# Patient Record
Sex: Male | Born: 2000 | Hispanic: Yes | Marital: Single | Smoking: Never smoker
Health system: Southern US, Community
[De-identification: ages and names within clinical notes are randomized; demographics above are authoritative.]

## PROBLEM LIST (undated history)

## (undated) HISTORY — PX: APPENDECTOMY: SHX54

---
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
## 2020-01-06 ENCOUNTER — Encounter: Payer: Self-pay | Admitting: Emergency Medicine

## 2020-01-06 ENCOUNTER — Other Ambulatory Visit: Payer: Self-pay

## 2020-01-06 DIAGNOSIS — K6289 Other specified diseases of anus and rectum: Secondary | ICD-10-CM | POA: Diagnosis present

## 2020-01-06 DIAGNOSIS — Z5321 Procedure and treatment not carried out due to patient leaving prior to being seen by health care provider: Secondary | ICD-10-CM | POA: Insufficient documentation

## 2020-01-06 NOTE — ED Triage Notes (Addendum)
Pt arrived via POV with reports of rectal pain and rectal swelling since Thursday.  PT states he has swelling to groin/pelvic area on the right side.   Pt states he does not have pain when he has a bowel movement but states when he is finished having a bowel movement it is painful. Pt states he has rectal pain when he is walking. PT states he has noticed some bright red spotting on toilet tissue when he wipes.  Pt also states he has frequent bowel movements and thinks he has IBS.

## 2020-01-07 ENCOUNTER — Emergency Department
Admission: EM | Admit: 2020-01-07 | Discharge: 2020-01-07 | Disposition: A | Discharge status: Home / Self Care | Payer: Medicaid Other | Attending: Emergency Medicine | Admitting: Emergency Medicine

## 2020-01-07 NOTE — ED Notes (Signed)
Pt not visualized in lobby at this time. 

## 2020-07-11 DIAGNOSIS — Z20822 Contact with and (suspected) exposure to covid-19: Secondary | ICD-10-CM | POA: Diagnosis not present

## 2020-08-15 DIAGNOSIS — E119 Type 2 diabetes mellitus without complications: Secondary | ICD-10-CM | POA: Diagnosis not present

## 2020-08-21 DIAGNOSIS — H5211 Myopia, right eye: Secondary | ICD-10-CM | POA: Diagnosis not present

## 2020-08-28 DIAGNOSIS — R0683 Snoring: Secondary | ICD-10-CM | POA: Diagnosis not present

## 2020-08-28 DIAGNOSIS — G4710 Hypersomnia, unspecified: Secondary | ICD-10-CM | POA: Diagnosis not present

## 2020-09-13 DIAGNOSIS — Z713 Dietary counseling and surveillance: Secondary | ICD-10-CM | POA: Diagnosis not present

## 2020-09-13 DIAGNOSIS — E119 Type 2 diabetes mellitus without complications: Secondary | ICD-10-CM | POA: Diagnosis not present

## 2020-09-18 DIAGNOSIS — Z6835 Body mass index (BMI) 35.0-35.9, adult: Secondary | ICD-10-CM | POA: Diagnosis not present

## 2020-09-18 DIAGNOSIS — K2970 Gastritis, unspecified, without bleeding: Secondary | ICD-10-CM | POA: Diagnosis not present

## 2020-09-18 DIAGNOSIS — R69 Illness, unspecified: Secondary | ICD-10-CM | POA: Diagnosis not present

## 2020-09-18 DIAGNOSIS — Z79899 Other long term (current) drug therapy: Secondary | ICD-10-CM | POA: Diagnosis not present

## 2020-09-18 DIAGNOSIS — K3184 Gastroparesis: Secondary | ICD-10-CM | POA: Diagnosis not present

## 2020-09-18 DIAGNOSIS — K3189 Other diseases of stomach and duodenum: Secondary | ICD-10-CM | POA: Diagnosis not present

## 2020-09-18 DIAGNOSIS — Z9089 Acquired absence of other organs: Secondary | ICD-10-CM | POA: Diagnosis not present

## 2020-09-18 DIAGNOSIS — E669 Obesity, unspecified: Secondary | ICD-10-CM | POA: Diagnosis not present

## 2020-09-18 DIAGNOSIS — E119 Type 2 diabetes mellitus without complications: Secondary | ICD-10-CM | POA: Diagnosis not present

## 2020-09-18 DIAGNOSIS — Z7984 Long term (current) use of oral hypoglycemic drugs: Secondary | ICD-10-CM | POA: Diagnosis not present

## 2020-09-18 DIAGNOSIS — K2950 Unspecified chronic gastritis without bleeding: Secondary | ICD-10-CM | POA: Diagnosis not present

## 2020-11-13 DIAGNOSIS — K591 Functional diarrhea: Secondary | ICD-10-CM | POA: Diagnosis not present

## 2020-11-13 DIAGNOSIS — R194 Change in bowel habit: Secondary | ICD-10-CM | POA: Diagnosis not present

## 2020-11-13 DIAGNOSIS — K580 Irritable bowel syndrome with diarrhea: Secondary | ICD-10-CM | POA: Diagnosis not present

## 2020-12-03 DIAGNOSIS — G4700 Insomnia, unspecified: Secondary | ICD-10-CM | POA: Diagnosis not present

## 2020-12-03 DIAGNOSIS — F411 Generalized anxiety disorder: Secondary | ICD-10-CM | POA: Diagnosis not present

## 2020-12-03 DIAGNOSIS — R69 Illness, unspecified: Secondary | ICD-10-CM | POA: Diagnosis not present

## 2020-12-03 DIAGNOSIS — R41840 Attention and concentration deficit: Secondary | ICD-10-CM | POA: Diagnosis not present

## 2021-10-30 ENCOUNTER — Encounter: Payer: Self-pay | Admitting: Dermatology

## 2021-10-30 ENCOUNTER — Ambulatory Visit (INDEPENDENT_AMBULATORY_CARE_PROVIDER_SITE_OTHER): Payer: No Typology Code available for payment source | Admitting: Dermatology

## 2021-10-30 DIAGNOSIS — L719 Rosacea, unspecified: Secondary | ICD-10-CM

## 2021-10-30 DIAGNOSIS — L710 Perioral dermatitis: Secondary | ICD-10-CM

## 2021-10-30 DIAGNOSIS — L649 Androgenic alopecia, unspecified: Secondary | ICD-10-CM | POA: Diagnosis not present

## 2021-10-30 DIAGNOSIS — L209 Atopic dermatitis, unspecified: Secondary | ICD-10-CM

## 2021-10-30 MED ORDER — MINOXIDIL 2.5 MG PO TABS: 2.5000 mg | ORAL_TABLET | Freq: Every day | ORAL | 2 refills | Status: DC

## 2021-10-30 MED ORDER — DOXYCYCLINE HYCLATE 50 MG PO CAPS: 50.0000 mg | ORAL_CAPSULE | Freq: Every day | ORAL | 2 refills | Status: DC

## 2021-10-30 NOTE — Patient Instructions (Addendum)
Perioral Dermatitis:  ?Continue Pimecrolimus as directed.  ?Start Doxycycline 50 mg once daily with dinner.  ? ?Scalp:  ?Recommend using topical Minoxidil as directed on label. ?Start Minoxidil 2.5 mg once daily. ? ?Recommend daily broad spectrum sunscreen SPF 30+ to sun-exposed areas, reapply every 2 hours as needed. Call for new or changing lesions.  ?Staying in the shade or wearing long sleeves, sun glasses (UVA+UVB protection) and wide brim hats (4-inch brim around the entire circumference of the hat) are also recommended for sun protection.  ? ? ?If You Need Anything After Your Visit ? ?If you have any questions or concerns for your doctor, please call our main line at [phone] and press option 4 to reach your doctor's medical assistant. If no one answers, please leave a voicemail as directed and we will return your call as soon as possible. Messages left after 4 pm will be answered the following business day.  ? ?You may also send Lawrence Robles a message via MyChart. We typically respond to MyChart messages within 1-2 business days. ? ?For prescription refills, please ask your pharmacy to contact our office. Our fax number is [phone]. ? ?If you have an urgent issue when the clinic is closed that cannot wait until the next business day, you can page your doctor at the number below.   ? ?Please note that while we do our best to be available for urgent issues outside of office hours, we are not available 24/7.  ? ?If you have an urgent issue and are unable to reach Lawrence Robles, you may choose to seek medical care at your doctor's office, retail clinic, urgent care center, or emergency room. ? ?If you have a medical emergency, please immediately call 911 or go to the emergency department. ? ?Pager Numbers ? ?- Dr. Gwen Pounds: [phone] ? ?- Dr. Neale Burly: [phone] ? ?- Dr. Roseanne Reno: [phone] ? ?In the event of inclement weather, please call our main line at [phone] for an update on the status of any delays or  closures. ? ?Dermatology Medication Tips: ?Please keep the boxes that topical medications come in in order to help keep track of the instructions about where and how to use these. Pharmacies typically print the medication instructions only on the boxes and not directly on the medication tubes.  ? ?If your medication is too expensive, please contact our office at [phone] option 4 or send Lawrence Robles a message through MyChart.  ? ?We are unable to tell what your co-pay for medications will be in advance as this is different depending on your insurance coverage. However, we may be able to find a substitute medication at lower cost or fill out paperwork to get insurance to cover a needed medication.  ? ?If a prior authorization is required to get your medication covered by your insurance company, please allow Lawrence Robles 1-2 business days to complete this process. ? ?Drug prices often vary depending on where the prescription is filled and some pharmacies may offer cheaper prices. ? ?The website www.goodrx.com contains coupons for medications through different pharmacies. The prices here do not account for what the cost may be with help from insurance (it may be cheaper with your insurance), but the website can give you the price if you did not use any insurance.  ?- You can print the associated coupon and take it with your prescription to the pharmacy.  ?- You may also stop by our office during regular business hours and pick up a GoodRx coupon card.  ?- If  you need your prescription sent electronically to a different pharmacy, notify our office through [facility] or by phone at [phone] option 4. ? ? ? ? ?Si Usted Necesita Algo Despu?s de Su Visita ? ?Tambi?n puede enviarnos un mensaje a trav?s de MyChart. Por lo general respondemos a los mensajes de MyChart en el transcurso de 1 a 2 d?as h?biles. ? ?Para renovar recetas, por favor pida a su farmacia que se ponga en contacto con nuestra oficina. Nuestro n?mero de fax  es el [phone]. ? ?Si tiene un asunto urgente cuando la cl?nica est? cerrada y que no puede esperar hasta el siguiente d?a h?bil, puede llamar/localizar a su doctor(a) al n?mero que aparece a continuaci?n.  ? ?Por favor, tenga en cuenta que aunque hacemos todo lo posible para estar disponibles para asuntos urgentes fuera del horario de oficina, no estamos disponibles las 24 horas del d?a, los 7 d?as de la semana.  ? ?Si tiene un problema urgente y no puede comunicarse con nosotros, puede optar por buscar atenci?n m?dica  en el consultorio de su doctor(a), en una cl?nica privada, en un centro de atenci?n urgente o en una sala de emergencias. ? ?Si tiene Engineer, maintenance (IT) m?dica, por favor llame inmediatamente al 911 o vaya a la sala de emergencias. ? ?N?meros de b?per ? ?- Dr. Nehemiah Massed: [phone] ? ?- Dra. Moye: [phone] ? ?- Dra. Nicole [phone] ? ?En caso de inclemencias del tiempo, por favor llame a nuestra l?nea principal al [phone] para una actualizaci?n sobre el estado de cualquier retraso o cierre. ? ?Consejos para la medicaci?n en dermatolog?a: ?Por favor, guarde las cajas en las que vienen los medicamentos de uso t?pico para ayudarle a seguir las instrucciones sobre d?nde y c?mo usarlos. Las farmacias generalmente imprimen las instrucciones del medicamento s?lo en las cajas y no directamente en los tubos del [place].  ? ?Si su medicamento es muy caro, por favor, p?ngase en contacto con Zigmund Daniel llamando al [phone] y presione la opci?n 4 o env?enos un mensaje a trav?s de MyChart.  ? ?No podemos decirle cu?l ser? su copago por los medicamentos por adelantado ya que esto es diferente dependiendo de la cobertura de su seguro. Sin embargo, es posible que podamos encontrar un medicamento sustituto a Electrical engineer un formulario para que el seguro cubra el medicamento que se considera necesario.  ? ?Si se requiere Ardelia Mems autorizaci?n previa para que su compa??a de seguros [place]  su medicamento, por favor perm?tanos de 1 a 2 d?as h?biles para completar este proceso. ? ?Los precios de los medicamentos var?an con frecuencia dependiendo del Environmental consultant de d?nde se surte la receta y alguna farmacias pueden ofrecer precios m?s baratos. ? ?El sitio web www.goodrx.com tiene cupones para medicamentos de Airline pilot. Los precios aqu? no tienen en cuenta lo que podr?a costar con la ayuda del seguro (puede ser m?s barato con su seguro), pero el sitio web puede darle el precio si no utiliz? ning?n seguro.  ?- Puede imprimir el cup?n correspondiente y llevarlo con su receta a la farmacia.  ?- Tambi?n puede pasar por nuestra oficina durante el horario de atenci?n regular y recoger una tarjeta de cupones de GoodRx.  ?- Si necesita que su receta se env?e electr?nicamente a Chiropodist, informe a nuestra oficina a trav?s de MyChart de [place] o por tel?fono llamando al [phone] y presione la opci?n 4.  ?

## 2021-10-30 NOTE — Progress Notes (Signed)
? ?  New Patient Visit ? ?Subjective  ?Lawrence Robles is a 21 y.o. male who presents for the following: Alopecia (Scalp. Dur years. Family hx of hair thinning, father. ) and Rash (Hx of eczema since childhood. 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 No active rash today. Uses Triamcinolone 0.5% cream as needed for flares. Thinks has perioral dermatitis on face. Has been using pimecrolimus on face, helping some). ? ?Review of Systems: No other skin or systemic complaints except as noted in HPI or Assessment and Plan. ? ?Objective  ?Well appearing patient in no apparent distress; mood and affect are within normal limits. ? ?A focused examination was performed including face, neck, scalp, feet. Relevant physical exam findings are noted in the Assessment and Plan. ? ?perioral, nasolabial ?Pink patches with greasy scale.  ? ?Scalp ?Diffuse thinning of hair at vertex or temples.  ? ?Left Hand - Anterior ?Hands and feet clear today ? ? 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 Condition is symptomatic / bothersome to patient. Not to goal. ? ?Continue Pimecrolimus as directed.  ?Start Doxycycline 50 mg once daily with dinner.  ? ?doxycycline (VIBRAMYCIN) 50 MG capsule - perioral, nasolabial ?Take 1 capsule (50 mg total) by mouth daily. Take with dinner ? 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 Condition is symptomatic / bothersome to patient. Not to goal. ? ?Recommend using topical Minoxidil as directed on label. ?Start Minoxidil 2.5 mg once daily. ? ?BP: 136/83 ?Pulse: 85 ?Wt: 165 lbs ? ? Androgenetic Alopecia (or Male pattern hair loss) refers to the common patterned hair loss affecting many men.  Male pattern alopecia is mediated by dihydrotestosterone which induces miniaturization of androgen-sensitive hair follicles.  It is chronic and  persistent, but treatable; not curable. ?Topical treatment includes: ?- 5% topical Minoxidil ?Oral treatment includes: ?- Finasteride 1 mg qd ?- Minoxidil 1.25 - 5 mg qd ?- Dutasteride 0.5 mg qd ?Adjunct therapy includes: ?- Low Level Laser Light Therapy (LLLT) ?- Platelet-rich Plasma injections (PRP) ?- Hair Transplantation or scalp reduction ? ?minoxidil (LONITEN) 2.5 MG tablet - Scalp ?Take 1 tablet (2.5 mg total) by mouth daily. ? 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 It is often associated with allergic rhinitis and/or asthma and can require treatment with topical medications, phototherapy, or in severe cases biologic injectable medication (Dupixent; Adbry) or Oral JAK inhibitors. ? ?May use triamcinolone 0.5% cream as needed for flares. ?May also use pimecrolimus on the hands when less significant ?Moisturizers as needed such as CeraVe cream ? ?Return in about 2 months (around 12/30/2021) for Alopecia Follow Up, Rash Follow Up. ? ?I, Lawson Radar, CMA, am acting as scribe for Armida Sans, MD. ?Documentation: I have reviewed the above documentation for accuracy and completeness, and I agree with the above. ? ?Armida Sans, MD ? ? ?

## 2021-11-06 ENCOUNTER — Other Ambulatory Visit: Payer: Self-pay

## 2021-11-10 MED ORDER — SULFACETAMIDE SODIUM-SULFUR 10-5 % EX CREA: TOPICAL_CREAM | CUTANEOUS | 3 refills | Status: DC

## 2021-11-11 ENCOUNTER — Encounter: Payer: Self-pay | Admitting: Dermatology

## 2021-11-11 MED ORDER — DOXYCYCLINE 40 MG PO CPDR: 40.0000 mg | DELAYED_RELEASE_CAPSULE | ORAL | 3 refills | Status: DC

## 2021-12-16 ENCOUNTER — Other Ambulatory Visit: Payer: Self-pay | Admitting: Dermatology

## 2021-12-25 ENCOUNTER — Ambulatory Visit (INDEPENDENT_AMBULATORY_CARE_PROVIDER_SITE_OTHER): Payer: No Typology Code available for payment source | Admitting: Dermatology

## 2021-12-25 ENCOUNTER — Encounter: Payer: Self-pay | Admitting: Dermatology

## 2021-12-25 DIAGNOSIS — L659 Nonscarring hair loss, unspecified: Secondary | ICD-10-CM

## 2021-12-25 DIAGNOSIS — L649 Androgenic alopecia, unspecified: Secondary | ICD-10-CM

## 2021-12-25 DIAGNOSIS — L658 Other specified nonscarring hair loss: Secondary | ICD-10-CM | POA: Diagnosis not present

## 2021-12-25 DIAGNOSIS — L710 Perioral dermatitis: Secondary | ICD-10-CM

## 2021-12-25 MED ORDER — MINOXIDIL 2.5 MG PO TABS: 2.5000 mg | ORAL_TABLET | Freq: Every day | ORAL | 2 refills | Status: DC

## 2021-12-25 NOTE — Progress Notes (Deleted)
   Follow-Up Visit   Subjective  Lawrence Robles is a 21 y.o. male who presents for the following: Follow-up.    The following portions of the chart were reviewed this encounter and updated as appropriate:       Review of Systems:  No other skin or systemic complaints except as noted in HPI or Assessment and Plan.  Objective  Well appearing patient in no apparent distress; mood and affect are within normal limits.  {Exam:34163::"A full examination was performed including scalp, head, eyes, ears, nose, lips, neck, chest, axillae, abdomen, back, buttocks, bilateral upper extremities, bilateral lower extremities, hands, feet, fingers, toes, fingernails, and toenails. All findings within normal limits unless otherwise noted below."}    Assessment & Plan   No follow-ups on file.  IAngelique Holm, CMA, am acting as scribe for Armida Sans, MD .

## 2021-12-25 NOTE — Progress Notes (Unsigned)
   Follow-Up Visit   Subjective  Lawrence Robles is a 21 y.o. male who presents for the following: Follow-up (2 months f/u on perioral dermatitis treating with Doxycycline 50 mg daily and pimecrolimus cream ) and Alopecia (2 months f/u alopecia on the scalp treating with Minoxidil 2.5 mg 1 tablet daily with a good response ).  The following portions of the chart were reviewed this encounter and updated as appropriate:   Tobacco  Allergies  Meds  Problems  Med Hx  Surg Hx  Fam Hx     Review of Systems:  No other skin or systemic complaints except as noted in HPI or Assessment and Plan.  Objective  Well appearing patient in no apparent distress; mood and affect are within normal limits.  A focused examination was performed including face,scalp. Relevant physical exam findings are noted in the Assessment and Plan.  Scalp Diffuse thinning of hair at vertex or temples.   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  Male pattern alopecia is mediated by dihydrotestosterone which induces miniaturization of androgen-sensitive hair follicles.  It is chronic and persistent, but treatable; not curable. Topical treatment includes: - 5% topical Minoxidil Oral treatment includes: - Finasteride 1 mg qd - Minoxidil 1.25 - 5 mg qd - Dutasteride 0.5 mg qd Adjunct therapy includes: - Low Level Laser Light Therapy (LLLT) - Platelet-rich Plasma injections (PRP) - Hair Transplantation or scalp reduction   Continue Minoxidil 2.5 mg take 1 tablet daily  Start Finasteride 1 mg daily   Perioral dermatitis perioral, nasolabial Improving Chronic and persistent condition with duration or expected duration over one year. Condition is symptomatic / bothersome to patient. Not to goal.   Continue Pimecrolimus as  directed.  Continue Doxycycline 50 mg once daily with dinner.   Related Medications minoxidil (LONITEN) 2.5 MG tablet Take 1 tablet (2.5 mg total) by mouth daily.  Atopic Dermatitis / dyshidrosis  Hands and feet Prn [facility] 0.5% cr or Pimecrolimus cream.   Long term medication management.  Patient is using long term (months to years) prescription medication  to control their dermatologic condition.  These medications require periodic monitoring to evaluate for efficacy and side effects and may require periodic laboratory monitoring.  Return in about 1 year (around 12/26/2022) for Alopecia, Perioral dermatitis .  IAngelique Holm, CMA, am acting as scribe for Armida Sans, MD .  Documentation: I have reviewed the above documentation for accuracy and completeness, and I agree with the above.  Armida Sans, MD

## 2021-12-25 NOTE — Patient Instructions (Signed)
Due to recent changes in healthcare laws, you may see results of your pathology and/or laboratory studies on MyChart before the doctors have had a chance to review them. We understand that in some cases there may be results that are confusing or concerning to you. Please understand that not all results are received at the same time and often the doctors may need to interpret multiple results in order to provide you with the best plan of care or course of treatment. Therefore, we ask that you please give us 2 business days to thoroughly review all your results before contacting the office for clarification. Should we see a critical lab result, you will be contacted sooner.   If You Need Anything After Your Visit  If you have any questions or concerns for your doctor, please call our main line at [phone] and press option 4 to reach your doctor's medical assistant. If no one answers, please leave a voicemail as directed and we will return your call as soon as possible. Messages left after 4 pm will be answered the following business day.   You may also send us a message via MyChart. We typically respond to MyChart messages within 1-2 business days.  For prescription refills, please ask your pharmacy to contact our office. Our fax number is [phone].  If you have an urgent issue when the clinic is closed that cannot wait until the next business day, you can page your doctor at the number below.    Please note that while we do our best to be available for urgent issues outside of office hours, we are not available 24/7.   If you have an urgent issue and are unable to reach us, you may choose to seek medical care at your doctor's office, retail clinic, urgent care center, or emergency room.  If you have a medical emergency, please immediately call 911 or go to the emergency department.  Pager Numbers  - Dr. Kowalski: [phone]  - Dr. Moye: [phone]  - Dr. Stewart:  [phone]  In the event of inclement weather, please call our main line at [phone] for an update on the status of any delays or closures.  Dermatology Medication Tips: Please keep the boxes that topical medications come in in order to help keep track of the instructions about where and how to use these. Pharmacies typically print the medication instructions only on the boxes and not directly on the medication tubes.   If your medication is too expensive, please contact our office at [phone] option 4 or send us a message through MyChart.   We are unable to tell what your co-pay for medications will be in advance as this is different depending on your insurance coverage. However, we may be able to find a substitute medication at lower cost or fill out paperwork to get insurance to cover a needed medication.   If a prior authorization is required to get your medication covered by your insurance company, please allow us 1-2 business days to complete this process.  Drug prices often vary depending on where the prescription is filled and some pharmacies may offer cheaper prices.  The website www.goodrx.com contains coupons for medications through different pharmacies. The prices here do not account for what the cost may be with help from insurance (it may be cheaper with your insurance), but the website can give you the price if you did not use any insurance.  - You can print the associated coupon and take it with   your prescription to the pharmacy.  - You may also stop by our office during regular business hours and pick up a GoodRx coupon card.  - If you need your prescription sent electronically to a different pharmacy, notify our office through [facility] MyChart or by phone at [phone] option 4.     Si Usted Necesita Algo Despus de Su Visita  Tambin puede enviarnos un mensaje a travs de MyChart. Por lo general respondemos a los mensajes de MyChart en el transcurso de 1 a 2  das hbiles.  Para renovar recetas, por favor pida a su farmacia que se ponga en contacto con nuestra oficina. Nuestro nmero de fax es el [phone].  Si tiene un asunto urgente cuando la clnica est cerrada y que no puede esperar hasta el siguiente da hbil, puede llamar/localizar a su doctor(a) al nmero que aparece a continuacin.   Por favor, tenga en cuenta que aunque hacemos todo lo posible para estar disponibles para asuntos urgentes fuera del horario de oficina, no estamos disponibles las 24 horas del da, los 7 das de la semana.   Si tiene un problema urgente y no puede comunicarse con nosotros, puede optar por buscar atencin mdica  en el consultorio de su doctor(a), en una clnica privada, en un centro de atencin urgente o en una sala de emergencias.  Si tiene una emergencia mdica, por favor llame inmediatamente al 911 o vaya a la sala de emergencias.  Nmeros de bper  - Dr. Kowalski: [phone]  - Dra. Moye: [phone]  - Dra. Stewart: [phone]  En caso de inclemencias del tiempo, por favor llame a nuestra lnea principal al [phone] para una actualizacin sobre el estado de cualquier retraso o cierre.  Consejos para la medicacin en dermatologa: Por favor, guarde las cajas en las que vienen los medicamentos de uso tpico para ayudarle a seguir las instrucciones sobre dnde y cmo usarlos. Las farmacias generalmente imprimen las instrucciones del medicamento slo en las cajas y no directamente en los tubos del medicamento.   Si su medicamento es muy caro, por favor, pngase en contacto con nuestra oficina llamando al [phone] y presione la opcin 4 o envenos un mensaje a travs de MyChart.   No podemos decirle cul ser su copago por los medicamentos por adelantado ya que esto es diferente dependiendo de la cobertura de su seguro. Sin embargo, es posible que podamos encontrar un medicamento sustituto a menor costo o llenar un formulario para que el  seguro cubra el medicamento que se considera necesario.   Si se requiere una autorizacin previa para que su compaa de seguros cubra su medicamento, por favor permtanos de 1 a 2 das hbiles para completar este proceso.  Los precios de los medicamentos varan con frecuencia dependiendo del lugar de dnde se surte la receta y alguna farmacias pueden ofrecer precios ms baratos.  El sitio web www.goodrx.com tiene cupones para medicamentos de diferentes farmacias. Los precios aqu no tienen en cuenta lo que podra costar con la ayuda del seguro (puede ser ms barato con su seguro), pero el sitio web puede darle el precio si no utiliz ningn seguro.  - Puede imprimir el cupn correspondiente y llevarlo con su receta a la farmacia.  - Tambin puede pasar por nuestra oficina durante el horario de atencin regular y recoger una tarjeta de cupones de GoodRx.  - Si necesita que su receta se enve electrnicamente a una farmacia diferente, informe a nuestra oficina a travs de MyChart de [facility]   o por telfono llamando al [phone] y presione la opcin 4.  

## 2021-12-31 MED ORDER — FINASTERIDE 1 MG PO TABS: 1.0000 mg | ORAL_TABLET | Freq: Every day | ORAL | 1 refills | Status: DC

## 2022-01-13 ENCOUNTER — Encounter: Payer: Self-pay | Admitting: Dermatology

## 2022-02-20 ENCOUNTER — Other Ambulatory Visit: Payer: Self-pay | Admitting: Dermatology

## 2022-02-20 DIAGNOSIS — L649 Androgenic alopecia, unspecified: Secondary | ICD-10-CM

## 2022-03-29 ENCOUNTER — Encounter: Payer: Self-pay | Admitting: Dermatology

## 2022-04-01 ENCOUNTER — Other Ambulatory Visit: Payer: Self-pay

## 2022-04-01 DIAGNOSIS — L659 Nonscarring hair loss, unspecified: Secondary | ICD-10-CM

## 2022-04-01 MED ORDER — FINASTERIDE 1 MG PO TABS: 1.0000 mg | ORAL_TABLET | Freq: Every day | ORAL | 0 refills | Status: DC

## 2022-04-04 ENCOUNTER — Other Ambulatory Visit: Payer: Self-pay | Admitting: Dermatology

## 2022-04-04 DIAGNOSIS — L719 Rosacea, unspecified: Secondary | ICD-10-CM

## 2022-05-02 ENCOUNTER — Encounter: Payer: Self-pay | Admitting: Dermatology

## 2022-07-05 ENCOUNTER — Other Ambulatory Visit: Payer: Self-pay | Admitting: Dermatology

## 2022-07-05 DIAGNOSIS — L659 Nonscarring hair loss, unspecified: Secondary | ICD-10-CM

## 2022-08-20 ENCOUNTER — Encounter: Payer: Self-pay | Admitting: Dermatology

## 2022-08-20 DIAGNOSIS — L710 Perioral dermatitis: Secondary | ICD-10-CM

## 2022-08-20 NOTE — Telephone Encounter (Signed)
Fine to send in both metronidazole cream to use twice a day and protopic to use once a day. Recommend sending protopic rx to [facility] or [facility] for better pricing.  We could also give him some samples of zilxi and epsolay to try. Each of these can be used a thin layer to affected areas at night and can be helpful for rosacea/perioral dermatitis.  I would recommend stopping the pimecrolimus. Thank you!

## 2022-08-27 ENCOUNTER — Other Ambulatory Visit: Payer: Self-pay | Admitting: Dermatology

## 2022-08-27 DIAGNOSIS — L710 Perioral dermatitis: Secondary | ICD-10-CM

## 2022-08-27 MED ORDER — METRONIDAZOLE 0.75 % EX CREA: TOPICAL_CREAM | Freq: Two times a day (BID) | CUTANEOUS | 3 refills | Status: DC

## 2022-08-27 MED ORDER — TACROLIMUS 0.1 % EX OINT: TOPICAL_OINTMENT | CUTANEOUS | 0 refills | Status: DC

## 2022-08-27 NOTE — Addendum Note (Signed)
Addended by: Rudell Cobb A on: 08/27/2022 02:16 PM   Modules accepted: Orders

## 2022-10-01 ENCOUNTER — Other Ambulatory Visit: Payer: Self-pay | Admitting: Dermatology

## 2022-10-01 DIAGNOSIS — L710 Perioral dermatitis: Secondary | ICD-10-CM

## 2022-10-01 DIAGNOSIS — L659 Nonscarring hair loss, unspecified: Secondary | ICD-10-CM

## 2022-12-30 ENCOUNTER — Ambulatory Visit (INDEPENDENT_AMBULATORY_CARE_PROVIDER_SITE_OTHER): Payer: BC Managed Care – PPO | Admitting: Dermatology

## 2022-12-30 VITALS — BP 141/81 | HR 109

## 2022-12-30 DIAGNOSIS — L659 Nonscarring hair loss, unspecified: Secondary | ICD-10-CM

## 2022-12-30 DIAGNOSIS — Z79899 Other long term (current) drug therapy: Secondary | ICD-10-CM

## 2022-12-30 DIAGNOSIS — L649 Androgenic alopecia, unspecified: Secondary | ICD-10-CM

## 2022-12-30 DIAGNOSIS — L2089 Other atopic dermatitis: Secondary | ICD-10-CM

## 2022-12-30 DIAGNOSIS — L710 Perioral dermatitis: Secondary | ICD-10-CM | POA: Diagnosis not present

## 2022-12-30 DIAGNOSIS — Z7189 Other specified counseling: Secondary | ICD-10-CM

## 2022-12-30 MED ORDER — FINASTERIDE 1 MG PO TABS: 1.0000 mg | ORAL_TABLET | Freq: Every day | ORAL | 3 refills | Status: DC

## 2022-12-30 MED ORDER — MINOXIDIL 2.5 MG PO TABS
2.5000 mg | ORAL_TABLET | Freq: Every day | ORAL | 3 refills | Status: DC
Start: 2022-12-30 — End: 2023-06-07

## 2022-12-30 NOTE — Patient Instructions (Signed)
Instructions for [facility] Medications  One or more of your medications was sent to the [facility] mail order compounding pharmacy. You will receive an email from them and can purchase the medicine through that link. It will then be mailed to your home at the address you confirmed. If for any reason you do not receive an email from them, please check your spam folder. If you still do not find the email, please let us know. [facility] phone number is [phone].       Due to recent changes in healthcare laws, you may see results of your pathology and/or laboratory studies on MyChart before the doctors have had a chance to review them. We understand that in some cases there may be results that are confusing or concerning to you. Please understand that not all results are received at the same time and often the doctors may need to interpret multiple results in order to provide you with the best plan of care or course of treatment. Therefore, we ask that you please give us 2 business days to thoroughly review all your results before contacting the office for clarification. Should we see a critical lab result, you will be contacted sooner.   If You Need Anything After Your Visit  If you have any questions or concerns for your doctor, please call our main line at [phone] and press option 4 to reach your doctor's medical assistant. If no one answers, please leave a voicemail as directed and we will return your call as soon as possible. Messages left after 4 pm will be answered the following business day.   You may also send us a message via MyChart. We typically respond to MyChart messages within 1-2 business days.  For prescription refills, please ask your pharmacy to contact our office. Our fax number is [phone].  If you have an urgent issue when the clinic is closed that cannot wait until the next business day, you can page your doctor at the number below.    Please note  that while we do our best to be available for urgent issues outside of office hours, we are not available 24/7.   If you have an urgent issue and are unable to reach us, you may choose to seek medical care at your doctor's office, retail clinic, urgent care center, or emergency room.  If you have a medical emergency, please immediately call 911 or go to the emergency department.  Pager Numbers  - Dr. Kowalski: [phone]  - Dr. Moye: [phone]  - Dr. Stewart: [phone]  In the event of inclement weather, please call our main line at [phone] for an update on the status of any delays or closures.  Dermatology Medication Tips: Please keep the boxes that topical medications come in in order to help keep track of the instructions about where and how to use these. Pharmacies typically print the medication instructions only on the boxes and not directly on the medication tubes.   If your medication is too expensive, please contact our office at [phone] option 4 or send us a message through MyChart.   We are unable to tell what your co-pay for medications will be in advance as this is different depending on your insurance coverage. However, we may be able to find a substitute medication at lower cost or fill out paperwork to get insurance to cover a needed medication.   If a prior authorization is required to get your medication covered by your insurance   company, please allow us 1-2 business days to complete this process.  Drug prices often vary depending on where the prescription is filled and some pharmacies may offer cheaper prices.  The website www.goodrx.com contains coupons for medications through different pharmacies. The prices here do not account for what the cost may be with help from insurance (it may be cheaper with your insurance), but the website can give you the price if you did not use any insurance.  - You can print the associated coupon and take it with your  prescription to the pharmacy.  - You may also stop by our office during regular business hours and pick up a GoodRx coupon card.  - If you need your prescription sent electronically to a different pharmacy, notify our office through [facility] MyChart or by phone at [phone] option 4.     Si Usted Necesita Algo Despus de Su Visita  Tambin puede enviarnos un mensaje a travs de MyChart. Por lo general respondemos a los mensajes de MyChart en el transcurso de 1 a 2 das hbiles.  Para renovar recetas, por favor pida a su farmacia que se ponga en contacto con nuestra oficina. Nuestro nmero de fax es el [phone].  Si tiene un asunto urgente cuando la clnica est cerrada y que no puede esperar hasta el siguiente da hbil, puede llamar/localizar a su doctor(a) al nmero que aparece a continuacin.   Por favor, tenga en cuenta que aunque hacemos todo lo posible para estar disponibles para asuntos urgentes fuera del horario de oficina, no estamos disponibles las 24 horas del da, los 7 das de la semana.   Si tiene un problema urgente y no puede comunicarse con nosotros, puede optar por buscar atencin mdica  en el consultorio de su doctor(a), en una clnica privada, en un centro de atencin urgente o en una sala de emergencias.  Si tiene una emergencia mdica, por favor llame inmediatamente al 911 o vaya a la sala de emergencias.  Nmeros de bper  - Dr. Kowalski: [phone]  - Dra. Moye: [phone]  - Dra. Stewart: [phone]  En caso de inclemencias del tiempo, por favor llame a nuestra lnea principal al [phone] para una actualizacin sobre el estado de cualquier retraso o cierre.  Consejos para la medicacin en dermatologa: Por favor, guarde las cajas en las que vienen los medicamentos de uso tpico para ayudarle a seguir las instrucciones sobre dnde y cmo usarlos. Las farmacias generalmente imprimen las instrucciones del medicamento slo en las cajas y no  directamente en los tubos del medicamento.   Si su medicamento es muy caro, por favor, pngase en contacto con nuestra oficina llamando al [phone] y presione la opcin 4 o envenos un mensaje a travs de MyChart.   No podemos decirle cul ser su copago por los medicamentos por adelantado ya que esto es diferente dependiendo de la cobertura de su seguro. Sin embargo, es posible que podamos encontrar un medicamento sustituto a menor costo o llenar un formulario para que el seguro cubra el medicamento que se considera necesario.   Si se requiere una autorizacin previa para que su compaa de seguros cubra su medicamento, por favor permtanos de 1 a 2 das hbiles para completar este proceso.  Los precios de los medicamentos varan con frecuencia dependiendo del lugar de dnde se surte la receta y alguna farmacias pueden ofrecer precios ms baratos.  El sitio web www.goodrx.com tiene cupones para medicamentos de diferentes farmacias. Los precios aqu no tienen en cuenta   lo que podra costar con la ayuda del seguro (puede ser ms barato con su seguro), pero el sitio web puede darle el precio si no utiliz ningn seguro.  - Puede imprimir el cupn correspondiente y llevarlo con su receta a la farmacia.  - Tambin puede pasar por nuestra oficina durante el horario de atencin regular y recoger una tarjeta de cupones de GoodRx.  - Si necesita que su receta se enve electrnicamente a una farmacia diferente, informe a nuestra oficina a travs de MyChart de [facility] o por telfono llamando al [phone] y presione la opcin 4.  

## 2022-12-30 NOTE — Progress Notes (Signed)
Follow-Up Visit   Subjective  Lawrence Robles is a 22 y.o. 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 He thinks his hair loss has stabilized and would like to continue medications. Perioral dermatitis - patient currently using Oracea 40 mg po every day and pimecrolimus PRN. He did try to decrease using Oracea, but he started to flare again. He does still experience redness and irritation around his nose, and Pimecrolimus helps some, but he doesn't like using a topical everyday. 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   The following portions of the chart were reviewed this encounter and updated as appropriate: medications, allergies, medical history  Review of Systems:  No other skin or systemic complaints except as noted in HPI or Assessment and Plan.  Objective  Well appearing patient in no apparent distress; mood and affect are within normal limits. A focused examination was performed of the following areas: the face, hair, and hands Relevant exam findings are noted in the Assessment and Plan.   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  Male pattern alopecia is mediated by dihydrotestosterone which induces miniaturization of androgen-sensitive hair follicles.  It is chronic and persistent, but treatable; not curable. Topical treatment includes: - 5% topical Minoxidil Oral treatment includes: - Finasteride 1 mg qd - Minoxidil 1.25 - 5 mg qd - Dutasteride 0.5 mg qd Adjunct therapy includes: - Low Level Laser Light Therapy (LLLT) - Platelet-rich Plasma injections (PRP) - Hair Transplantation or scalp reduction    Continue Minoxidil 2.5 mg take 1 tablet daily  And  Finasteride 1 mg daily No side effects from either of these meds.  No ankle swelling or Libido change.  Pt wants to continue both of these meds for hair maintenance. Long term medication management.  Patient is using long term (months to years) prescription medication  to control their dermatologic condition.  These medications require periodic monitoring to evaluate for efficacy and side effects and may require periodic laboratory monitoring.  Perioral dermatitis perioral, nasolabial Improving Chronic and persistent condition with duration or expected duration over one year. Condition is symptomatic / bothersome to patient. Not to goal. Continue Pimecrolimus as directed.  Continue Oracea 40 mg po once daily with dinner.  Will prescribe [employer] rosacea mix once to twice daily as needed.  ATOPIC DERMATITIS/dyshidrosis  Exam: Clear Chronic condition with duration or expected duration over one year. Currently well-controlled.  Atopic dermatitis (eczema) is a chronic, relapsing, pruritic condition that can significantly affect quality of life. It is often associated with allergic rhinitis and/or asthma and can require treatment with topical medications, phototherapy, or in severe cases biologic injectable medication (Dupixent; Adbry) or Oral JAK inhibitors.  Treatment Plan: Continue [facility] 0.1% cream to aa's once to twice daily PRN. Topical steroids (such as triamcinolone, fluocinolone, fluocinonide, mometasone, clobetasol, halobetasol, betamethasone, hydrocortisone) can cause thinning and lightening of the skin if they are used for too long in the same area. Your physician has selected the right strength medicine for your problem and area affected on the body. Please use your medication only as directed by your physician to prevent side effects.  Continue Pimecrolimus cream to aa's once to twice daily PRN.  Recommend gentle skin care. Consider Eucrisa or Opzelura if needed in the future.    Return in about 1 year (around 12/30/2023) for medication refills.  Maylene Roes, CMA, am acting as scribe for Armida Sans, MD .  Documentation: I have reviewed the above documentation for accuracy and completeness, and I agree with the above.  Armida Sans, MD

## 2023-01-01 ENCOUNTER — Encounter: Payer: Self-pay | Admitting: Dermatology

## 2023-01-20 ENCOUNTER — Other Ambulatory Visit: Payer: Self-pay | Admitting: Dermatology

## 2023-01-20 DIAGNOSIS — L659 Nonscarring hair loss, unspecified: Secondary | ICD-10-CM

## 2023-06-02 ENCOUNTER — Emergency Department (HOSPITAL_COMMUNITY)
Admission: EM | Admit: 2023-06-02 | Discharge: 2023-06-04 | Disposition: A | Discharge status: Other Acute Inpatient Hospital | Payer: BC Managed Care – PPO | Attending: Emergency Medicine | Admitting: Emergency Medicine

## 2023-06-02 ENCOUNTER — Other Ambulatory Visit: Payer: Self-pay

## 2023-06-02 DIAGNOSIS — F32A Depression, unspecified: Secondary | ICD-10-CM

## 2023-06-02 DIAGNOSIS — F332 Major depressive disorder, recurrent severe without psychotic features: Secondary | ICD-10-CM | POA: Diagnosis not present

## 2023-06-02 DIAGNOSIS — R45851 Suicidal ideations: Secondary | ICD-10-CM | POA: Diagnosis not present

## 2023-06-02 LAB — URINE DRUG SCREEN, QUALITATIVE (ARMC ONLY)
Amphetamines, Ur Screen: POSITIVE — AB
Barbiturates, Ur Screen: NOT DETECTED
Benzodiazepine, Ur Scrn: NOT DETECTED
Cannabinoid 50 Ng, Ur ~~LOC~~: NOT DETECTED
Cocaine Metabolite,Ur ~~LOC~~: NOT DETECTED
MDMA (Ecstasy)Ur Screen: NOT DETECTED
Methadone Scn, Ur: NOT DETECTED
Opiate, Ur Screen: POSITIVE — AB
Phencyclidine (PCP) Ur S: NOT DETECTED
Tricyclic, Ur Screen: NOT DETECTED

## 2023-06-02 LAB — CBC
HCT: 40.8 % (ref 39.0–52.0)
Hemoglobin: 14.9 g/dL (ref 13.0–17.0)
MCH: 33 pg (ref 26.0–34.0)
MCHC: 36.5 g/dL — ABNORMAL HIGH (ref 30.0–36.0)
MCV: 90.3 fL (ref 80.0–100.0)
Platelets: 269 10*3/uL (ref 150–400)
RBC: 4.52 MIL/uL (ref 4.22–5.81)
RDW: 11.9 % (ref 11.5–15.5)
WBC: 5.4 10*3/uL (ref 4.0–10.5)
nRBC: 0 % (ref 0.0–0.2)

## 2023-06-02 LAB — COMPREHENSIVE METABOLIC PANEL
ALT: 21 U/L (ref 0–44)
AST: 21 U/L (ref 15–41)
Albumin: 4.6 g/dL (ref 3.5–5.0)
Alkaline Phosphatase: 53 U/L (ref 38–126)
Anion gap: 10 (ref 5–15)
BUN: 19 mg/dL (ref 6–20)
CO2: 27 mmol/L (ref 22–32)
Calcium: 9 mg/dL (ref 8.9–10.3)
Chloride: 99 mmol/L (ref 98–111)
Creatinine, Ser: 0.86 mg/dL (ref 0.61–1.24)
GFR, Estimated: >60 mL/min (ref >60)
Glucose, Bld: 72 mg/dL (ref 70–99)
Potassium: 3.2 mmol/L — ABNORMAL LOW (ref 3.5–5.1)
Sodium: 136 mmol/L (ref 135–145)
Total Bilirubin: 0.9 mg/dL (ref <1.2)
Total Protein: 7.3 g/dL (ref 6.5–8.1)

## 2023-06-02 LAB — ACETAMINOPHEN LEVEL: Acetaminophen (Tylenol), Serum: 14 ug/mL (ref 10–30)

## 2023-06-02 LAB — ETHANOL: Alcohol, Ethyl (B): <10 mg/dL (ref <10)

## 2023-06-02 LAB — SALICYLATE LEVEL: Salicylate Lvl: <7 mg/dL — ABNORMAL LOW (ref 7.0–30.0)

## 2023-06-02 NOTE — ED Notes (Addendum)
Navy shoes Green set of scrubs Black long sleeve shirt White socks Grey underwear Black jacket 9 pieces of jewelry  Cell phone

## 2023-06-02 NOTE — ED Triage Notes (Signed)
Pt presents from with IVC papers and SI. Pt cooperative.

## 2023-06-02 NOTE — ED Notes (Signed)
Pt was given water.

## 2023-06-03 DIAGNOSIS — R45851 Suicidal ideations: Secondary | ICD-10-CM

## 2023-06-03 LAB — ACETAMINOPHEN LEVEL: Acetaminophen (Tylenol), Serum: 10 ug/mL (ref 10–30)

## 2023-06-03 MED ORDER — ESCITALOPRAM OXALATE 10 MG PO TABS
10.0000 mg | ORAL_TABLET | Freq: Every day | ORAL | Status: DC
  Administered 2023-06-03: 10 mg via ORAL
  Filled 2023-06-03: qty 1

## 2023-06-03 MED ORDER — HYDROXYZINE HCL 25 MG PO TABS: 25.0000 mg | ORAL_TABLET | Freq: Three times a day (TID) | ORAL | Status: DC | PRN

## 2023-06-03 NOTE — ED Provider Notes (Signed)
[facility] Provider Note    Event Date/Time   First MD Initiated Contact with Patient 06/02/23 2307     (approximate)   History   Suicidal   HPI Lawrence Robles is a 22 y.o. male who presents under IVC for suicidal ideation.  He says he has been depressed for a while and then he decided to kill himself tonight by taking an overdose (4 tablets) of his grandmothers Percocet.  He said he feels better now.  No physical signs or symptoms.  He did not elaborate on what is making him feel more depressed than usual.  He denies taking any other substances.  He denies any pain or shortness of breath at this time.  He had some nausea earlier but it has completely resolved.     Physical Exam   Triage Vital Signs: ED Triage Vitals  Encounter Vitals Group     BP 06/02/23 2217 (!) 129/99     Systolic BP Percentile --      Diastolic BP Percentile --      Pulse Rate 06/02/23 2217 90     Resp 06/02/23 2217 16     Temp 06/02/23 2217 98.5 F (36.9 C)     Temp Source 06/02/23 2217 Oral     SpO2 06/02/23 2217 100 %     Weight 06/02/23 2218 68 kg (150 lb)     Height 06/02/23 2218 1.727 m (5\' 8" )     Head Circumference --      Peak Flow --      Pain Score --      Pain Loc --      Pain Education --      Exclude from Growth Chart --     Most recent vital signs: Vitals:   06/02/23 2217  BP: (!) 129/99  Pulse: 90  Resp: 16  Temp: 98.5 F (36.9 C)  SpO2: 100%    General: Awake, no distress.  CV:  Good peripheral perfusion.  Regular rate and rhythm. Resp:  Normal effort. Speaking easily and comfortably, no accessory muscle usage nor intercostal retractions.   Abd:  No distention.    ED Results / Procedures / Treatments   Labs (all labs ordered are listed, but only abnormal results are displayed) Labs Reviewed  COMPREHENSIVE METABOLIC PANEL - Abnormal; Notable for the following components:      Result Value   Potassium 3.2 (*)    All other components  within normal limits  SALICYLATE LEVEL - Abnormal; Notable for the following components:   Salicylate Lvl <7.0 (*)    All other components within normal limits  CBC - Abnormal; Notable for the following components:   MCHC 36.5 (*)    All other components within normal limits  URINE DRUG SCREEN, QUALITATIVE ([facility] ONLY) - Abnormal; Notable for the following components:   Amphetamines, Ur Screen POSITIVE (*)    Opiate, Ur Screen POSITIVE (*)    All other components within normal limits  ETHANOL  ACETAMINOPHEN LEVEL  ACETAMINOPHEN LEVEL     EKG  ED ECG REPORT I, Loleta Rose, the attending physician, personally viewed and interpreted this ECG.  Date: 06/02/2023 EKG Time: 23: 19 Rate: 80 Rhythm: normal sinus rhythm QRS Axis: normal Intervals: normal ST/T Wave abnormalities: normal Narrative Interpretation: no evidence of acute ischemia     PROCEDURES:  Critical Care performed: No  Procedures    IMPRESSION / MDM / ASSESSMENT AND PLAN / ED COURSE  I reviewed  the triage vital signs and the nursing notes.                              Differential diagnosis includes, but is not limited to, depression, mood disorder, adjustment disorder, suicidal ideation/attempt.  Patient's presentation is most consistent with acute presentation with potential threat to life or bodily function.  Labs/studies ordered: As per protocol, I ordered the following labs as part of the patient's medical and psychiatric evaluation:  CBC, CMP, ethanol level, acetaminophen level, salicylate level, urine drug screen.  Interventions/Medications given:  Medications - No data to display  (Note:  hospital course my include additional interventions and/or labs/studies not listed above.)   No medical complaints or other concerns, minimal ingestion but with concern for suicidal intent.  Vital signs stable.  Medically cleared for psychiatric evaluation and disposition.The patient has been placed in  psychiatric observation due to the need to provide a safe environment for the patient while obtaining psychiatric consultation and evaluation, as well as ongoing medical and medication management to treat the patient's condition.  The patient has been placed under full IVC at this time.          FINAL CLINICAL IMPRESSION(S) / ED DIAGNOSES   Final diagnoses:  Depression, unspecified depression type  Suicidal ideation     Rx / DC Orders   ED Discharge Orders     None        Note:  This document was prepared using Dragon voice recognition software and may include unintentional dictation errors.   Loleta Rose, MD 06/03/23 [phone]

## 2023-06-03 NOTE — ED Notes (Signed)
Patient ambulated to restroom.

## 2023-06-03 NOTE — ED Notes (Signed)
Pt's mother came to ED requesting to visit with pt.  RN & PT Advocate spoke with patient and he declined seeing mother.  Also stated that if she calls she is to have no information r/t his admission

## 2023-06-03 NOTE — BH Assessment (Signed)
Comprehensive Clinical Assessment (CCA) Note  06/03/2023 Lawrence Robles [id number]  Chief Complaint: Patient is a 22 year old male presenting to [facility] ED under IVC. Per triage note Pt presents from with IVC papers and SI, per IVC paperwork patient texted a friend that he was "tired of it all" and made a plan to end his life via pills. During assessment patient appears alert and oriented x4, calm and cooperative. Patient reports "I tried to commit suicide, I've been going through a lot." "I deal with a lot emotionally, I get overwhelmed and I feel trapped." Patient reports thoughts of taking pain medications "I took some of it but not all of it." Patient reports this is his first attempt to end his life but has engaged in self-harm via cutting, he reports that he last cute 1 week ago. Patient reports having depression "for a while, since 2020, it was pretty well managed but this year got difficult." Patient is currently living with his grandmother and reports some of his stress stemming from his mother "she was just released from jail and I haven't had contact with her but my grandmother wants her back in her life and I live with my grandmother so it's like my safe place isn't safe anymore with my mother there." Patient reports that he had a psyc provider in the past through his pediatric office but "they got shut down and I've been without my sleeping medication for a few months now", he is also taking ADHD and additional mental health medications as prescribed. Patient denies current SI/HI/AH/VH Chief Complaint  Patient presents with   Suicidal   Visit Diagnosis: Major Depressive Disorder, recurrent episode, severe    CCA Screening, Triage and Referral (STR)  Patient Reported Information How did you hear about [place]? Legal System  Referral name: No data recorded Referral phone number: No data recorded  Whom do you see for routine medical problems? No data recorded Practice/Facility Name: No data  recorded Practice/Facility Phone Number: No data recorded Name of Contact: No data recorded Contact Number: No data recorded Contact Fax Number: No data recorded Prescriber Name: No data recorded Prescriber Address (if known): No data recorded  What Is the Reason for Your Visit/Call Today? Pt presents from with IVC papers and SI. Pt cooperative  How Long Has This Been Causing You Problems? > than 6 months  What Do You Feel Would Help You the Most Today? No data recorded  Have You Recently Been in Any Inpatient Treatment (Hospital/Detox/Crisis Center/28-Day Program)? No data recorded Name/Location of Program/Hospital:No data recorded How Long Were You There? No data recorded When Were You Discharged? No data recorded  Have You Ever Received Services From [facility] Before? No data recorded Who Do You See at [facility]? No data recorded  Have You Recently Had Any Thoughts About Hurting Yourself? Yes  Are You Planning to Commit Suicide/Harm Yourself At This time? No   Have you Recently Had Thoughts About Hurting Someone Karolee Ohs? No  Explanation: No data recorded  Have You Used Any Alcohol or Drugs in the Past 24 Hours? No data recorded How Long Ago Did You Use Drugs or Alcohol? No data recorded What Did You Use and How Much? No data recorded  Do You Currently Have a Therapist/Psychiatrist? No data recorded Name of Therapist/Psychiatrist: None   Have You Been Recently Discharged From Any Office Practice or Programs? No  Explanation of Discharge From Practice/Program: No data recorded    CCA Screening Triage Referral Assessment  Type of Contact: Face-to-Face  Is this Initial or Reassessment? No data recorded Date Telepsych consult ordered in [facility]:  No data recorded Time Telepsych consult ordered in [facility]:  No data recorded  Patient Reported Information Reviewed? No data recorded Patient Left Without Being Seen? No data recorded Reason for Not Completing Assessment: No data  recorded  Collateral Involvement: No data recorded  Does Patient Have a Court Appointed Legal Guardian? No data recorded Name and Contact of Legal Guardian: No data recorded If Minor and Not Living with Parent(s), Who has Custody? No data recorded Is CPS involved or ever been involved? Never  Is APS involved or ever been involved? Never   Patient Determined To Be At Risk for Harm To Self or Others Based on Review of Patient Reported Information or Presenting Complaint? Yes, for Self-Harm  Method: No data recorded Availability of Means: No data recorded Intent: No data recorded Notification Required: No data recorded Additional Information for Danger to Others Potential: No data recorded Additional Comments for Danger to Others Potential: No data recorded Are There Guns or Other Weapons in Your Home? No  Types of Guns/Weapons: No data recorded Are These Weapons Safely Secured?                            No data recorded Who Could Verify You Are Able To Have These Secured: No data recorded Do You Have any Outstanding Charges, Pending Court Dates, Parole/Probation? No data recorded Contacted To Inform of Risk of Harm To Self or Others: No data recorded  Location of Assessment[facility]   Does Patient Present under Involuntary Commitment? Yes  IVC Papers Initial File Date: No data recorded  [place] of Residence: [place]   Patient Currently Receiving the Following Services: No data recorded  Determination of Need: Emergent (2 hours)   Options For Referral: No data recorded    CCA Biopsychosocial Intake/Chief Complaint:  No data recorded Current Symptoms/Problems: No data recorded  Patient Reported Schizophrenia/Schizoaffective Diagnosis in Past: No   Strengths: Patient is able to communicate his needs; has friends and some family  Preferences: No data recorded Abilities: No data recorded  Type of Services Patient Feels are Needed: No data recorded  Initial  Clinical Notes/Concerns: No data recorded  Mental Health Symptoms Depression:   Change in energy/activity; Fatigue; Hopelessness; Worthlessness   Duration of Depressive symptoms:  Greater than two weeks   Mania:   None   Anxiety:    Difficulty concentrating; Restlessness; Tension   Psychosis:   None   Duration of Psychotic symptoms: No data recorded  Trauma:   None   Obsessions:   None   Compulsions:   None   Inattention:   None   Hyperactivity/Impulsivity:   None   Oppositional/Defiant Behaviors:   None   Emotional Irregularity:   None   Other Mood/Personality Symptoms:  No data recorded   Mental Status Exam Appearance and self-care  Stature:   Average   Weight:   Average weight   Clothing:   Casual   Grooming:   Normal   Cosmetic use:   None   Posture/gait:   Normal   Motor activity:   Not Remarkable   Sensorium  Attention:   Normal   Concentration:   Normal   Orientation:   X5   Recall/memory:   Normal   Affect and Mood  Affect:   Appropriate   Mood:   Depressed  Relating  Eye contact:   Normal   Facial expression:   Responsive   Attitude toward examiner:   Cooperative   Thought and Language  Speech flow:  Clear and Coherent   Thought content:   Appropriate to Mood and Circumstances   Preoccupation:   None   Hallucinations:   None   Organization:  No data recorded  [employer] of Knowledge:   Fair   Intelligence:   Average   Abstraction:   Normal   Judgement:   Good   Reality Testing:   Adequate   Insight:   Fair   Decision Making:   Impulsive   Social Functioning  Social Maturity:   Impulsive   Social Judgement:   Heedless   Stress  Stressors:   Family conflict   Coping Ability:   Contractor Deficits:   None   Supports:   Family; Friends/Service system     Religion: Religion/Spirituality Are You A Religious Person?:  No  Leisure/Recreation: Leisure / Recreation Do You Have Hobbies?: No  Exercise/Diet: Exercise/Diet Do You Exercise?: No Have You Gained or Lost A Significant Amount of Weight in the Past Six Months?: No Do You Follow a Special Diet?: No Do You Have Any Trouble Sleeping?: No   CCA Employment/Education Employment/Work Situation: Employment / Work Situation Employment Situation: Employed Work Stressors: None reported Patient's Job has Been Impacted by Current Illness: No Has Patient ever Been in Equities trader?: No  Education: Education Is Patient Currently Attending School?: No Did You Have An Individualized Education Program (IIEP): No Did You Have Any Difficulty At [employer]?: No Patient's Education Has Been Impacted by Current Illness: No   CCA Family/Childhood History Family and Relationship History: Family history Marital status: Single Does patient have children?: No  Childhood History:  Childhood History By whom was/is the patient raised?: Grandparents Did patient suffer any verbal/emotional/physical/sexual abuse as a child?: No Did patient suffer from severe childhood neglect?: No Has patient ever been sexually abused/assaulted/raped as an adolescent or adult?: No Was the patient ever a victim of a crime or a disaster?: No Witnessed domestic violence?: No Has patient been affected by domestic violence as an adult?: No  Child/Adolescent Assessment:     CCA Substance Use Alcohol/Drug Use: Alcohol / Drug Use Pain Medications: see mar Prescriptions: see mar Over the Counter: see mar History of alcohol / drug use?: No history of alcohol / drug abuse                         ASAM's:  Six Dimensions of Multidimensional Assessment  Dimension 1:  Acute Intoxication and/or Withdrawal Potential:      Dimension 2:  Biomedical Conditions and Complications:      Dimension 3:  Emotional, Behavioral, or Cognitive Conditions and Complications:      Dimension 4:  Readiness to Change:     Dimension 5:  Relapse, Continued use, or Continued Problem Potential:     Dimension 6:  Recovery/Living Environment:     ASAM Severity Score:    ASAM Recommended Level of Treatment:     Substance use Disorder (SUD)    Recommendations for Services/Supports/Treatments:    DSM5 Diagnoses: There are no problems to display for this patient.   Patient Centered Plan: Patient is on the following Treatment Plan(s):  Depression   Referrals to Alternative Service(s): Referred to Alternative Service(s):   Place:   Date:   Time:  Referred to Alternative Service(s):   Place:   Date:   Time:    Referred to Alternative Service(s):   Place:   Date:   Time:    Referred to Alternative Service(s):   Place:   Date:   Time:      @[facility]LL[facility]FC[facility] @  [employer], LCAS-A

## 2023-06-03 NOTE — ED Notes (Signed)
Pt has 'best friend' at bedside

## 2023-06-03 NOTE — ED Notes (Signed)
PT IVC/ PENDING ADMISSION WHEN MED. CLEARED.

## 2023-06-03 NOTE — BH Assessment (Signed)
[facility] AC [name], notified via secure chat regarding patient recommendation for inpatient treatment. Patient pending review and possible bed with [facility].

## 2023-06-03 NOTE — ED Notes (Signed)
Pt was given breakfast tray 

## 2023-06-03 NOTE — Consult Note (Addendum)
[facility] Face-to-Face Psychiatry Consult   Reason for Consult:  Suicidal Ideation/ Attempt Referring Physician:  Emergency Room provider  Patient Identification: Lawrence Robles MRN:  [id number] Principal Diagnosis: Suicidal ideation Diagnosis:  Principal Problem:   Suicidal ideation   Total Time spent with patient: 15 minutes  Subjective:   Lawrence Robles is a 22 y.o. male.  Seen and evaluated face-to-face by this provider and TTS counselor.  Patient is awake, alert and oriented x 3.  He is denying suicidal or homicidal ideations.  He reports he wsa feeling overwhelmed and stressed on yesterday.  States he is feeling a lot better today however states he gets frustrated with his grandmother whom he resides with.  States that his biological mother recently reentered his life and his grandmother allows her to come and go as she pleases.  Reports frustration and feeling overwhelmed with his grandmother's decisions.  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  States he was followed by psychiatry and therapy services 3 to 4 months prior.  States that his psychiatrist recently lost their practice., stated that he is no longer taking any medications at this time.  States he was prescribed Quvuvuia, Lavbid and  Vyvanse .  States he has tried Lexapro in the past. Chart reviewed patient was prescribed Wellbutrin, Rozerem, Hydroxyzine, Adderall  and Buspar at [facility], 2 years prior.  He denied illicit drug use or substance abuse history.  Reports yesterday was his first suicide attempt.  Denied previous inpatient admissions.  Denied auditory or visual hallucinations.  Attempted to gain additional collateral however,  patient declined that this provider follow-up with his grandparents and/or mother.  Will recommend inpatient admission for mood stabilization.  During evaluation [name] is sitting in bed. He presented flat and guarded. he is alert/oriented x 4;  calm/cooperative; and mood congruent with affect.  Patient is speaking in a clear tone at moderate volume, and normal pace; with good eye contact. His  thought process is coherent and relevant; There is no indication that he is currently responding to internal/external stimuli or experiencing delusional thought content.  Patient denies suicidal/self-harm/homicidal ideation, psychosis, and paranoia.  Patient has remained calm throughout assessment and has answered questions appropriately  HPI:  per admission assessment note: "Patient is a 22 year old male presenting to [facility] ED under IVC. Per triage note Pt presents from with IVC papers and SI, per IVC paperwork patient texted a friend that he was "tired of it all" and made a plan to end his life via pills. During assessment patient appears alert and oriented x4, calm and cooperative. Patient reports "I tried to commit suicide, I've been going through a lot." "I deal with a lot emotionally, I get overwhelmed and I feel trapped." Patient reports thoughts of taking pain medications "I took some of it but not all of it." Patient reports this is his first attempt to end his life but has engaged in self-harm via cutting, he reports that he last cute 1 week ago. Patient reports having depression "for a while, since 2020, it was pretty well managed but this year got difficult." Patient is currently living with his grandmother and reports some of his stress stemming from his mother "she was just released from jail and I haven't had contact with her but my grandmother wants her back in her life and I live with my grandmother so it's like my safe place isn't safe anymore with my mother there." Patient  reports that he had a psyc provider in the past through his pediatric office but "they got shut down and I've been without my sleeping medication for a few months now", he is also taking ADHD and additional mental health medications as prescribed. Patient denies current  SI/HI/AH/VH "  Past Psychiatric History:   Risk to Self:   Risk to Others:   Prior Inpatient Therapy:   Prior Outpatient Therapy:    Past Medical History: No past medical history on file. No past surgical history on file. Family History: No family history on file. Family Psychiatric  History:  Social History:  Social History   Substance and Sexual Activity  Alcohol Use Not Currently     Social History   Substance and Sexual Activity  Drug Use Not on file    Social History   Socioeconomic History   Marital status: Single    Spouse name: Not on file   Number of children: Not on file   Years of education: Not on file   Highest education level: Not on file  Occupational History   Not on file  Tobacco Use   Smoking status: Never   Smokeless tobacco: Never  Vaping Use   Vaping status: Never Used  Substance and Sexual Activity   Alcohol use: Not Currently   Drug use: Not on file   Sexual activity: Not on file  Other Topics Concern   Not on file  Social History Narrative   Not on file   Social Determinants of Health   Financial Resource Strain: Low Risk  (03/05/2021)   Received from [facility], [facility]   Overall Financial Resource Strain (CARDIA)    Difficulty of Paying Living Expenses: Not hard at all  Food Insecurity: No Food Insecurity (03/05/2021)   Received from [facility], [facility]   Hunger Vital Sign    Worried About Running Out of Food in the Last Year: Never true    Ran Out of Food in the Last Year: Never true  Transportation Needs: No Transportation Needs (03/05/2021)   Received from [facility], [facility]   PRAPARE - Transportation    Lack of Transportation (Medical): No    Lack of Transportation (Non-Medical): No  Physical Activity: Inactive (03/05/2021)   Received from [facility], [facility]   Exercise Vital Sign    Days of Exercise per Week: 0 days    Minutes of Exercise per Session: 0 min  Stress: No Stress Concern  Present (03/05/2021)   Received from [facility], [facility] - Occupational Stress Questionnaire    Feeling of Stress : Only a little  Social Connections: Unknown (11/18/2021)   Received from [facility], [facility]   Social Network    Social Network: Not on file   Additional Social History:    Allergies:   Allergies  Allergen Reactions   Neosporin [Bacitracin-Polymyxin B] Itching    Labs:  Results for orders placed or performed during the hospital encounter of 06/02/23 (from the past 48 hour(s))  Comprehensive metabolic panel     Status: Abnormal   Collection Time: 06/02/23 10:19 PM  Result Value Ref Range   Sodium 136 135 - 145 mmol/L   Potassium 3.2 (L) 3.5 - 5.1 mmol/L   Chloride 99 98 - 111 mmol/L   CO2 27 22 - 32 mmol/L   Glucose, Bld 72 70 - 99 mg/dL    Comment: Glucose reference range applies only to samples  taken after fasting for at least 8 hours.   BUN 19 6 - 20 mg/dL   Creatinine, Ser 0.98 0.61 - 1.24 mg/dL   Calcium 9.0 8.9 - 11.9 mg/dL   Total Protein 7.3 6.5 - 8.1 g/dL   Albumin 4.6 3.5 - 5.0 g/dL   AST 21 15 - 41 U/L   ALT 21 0 - 44 U/L   Alkaline Phosphatase 53 38 - 126 U/L   Total Bilirubin 0.9 <1.2 mg/dL   GFR, Estimated >14 >78 mL/min    Comment: (NOTE) Calculated using the CKD-EPI Creatinine Equation (2021)    Anion gap 10 5 - 15    Comment: Performed at [facility], [address] [place], [place] [address]  Ethanol     Status: None   Collection Time: 06/02/23 10:19 PM  Result Value Ref Range   Alcohol, Ethyl (B) <10 <10 mg/dL    Comment: (NOTE) Lowest detectable limit for serum alcohol is 10 mg/dL.  For medical purposes only. Performed at [facility], [address] [place], [place] [address]   Salicylate level     Status: Abnormal   Collection Time: 06/02/23 10:19 PM  Result Value Ref Range   Salicylate Lvl <7.0 (L) 7.0 - 30.0 mg/dL    Comment: Performed at  [facility], [address] [place], [place] [address]  Acetaminophen level     Status: None   Collection Time: 06/02/23 10:19 PM  Result Value Ref Range   Acetaminophen (Tylenol), Serum 14 10 - 30 ug/mL    Comment: (NOTE) Therapeutic concentrations vary significantly. A range of 10-30 ug/mL  may be an effective concentration for many patients. However, some  are best treated at concentrations outside of this range. Acetaminophen concentrations >150 ug/mL at 4 hours after ingestion  and >50 ug/mL at 12 hours after ingestion are often associated with  toxic reactions.  Performed at [facility], [address] [place], [place] [address]   cbc     Status: Abnormal   Collection Time: 06/02/23 10:19 PM  Result Value Ref Range   WBC 5.4 4.0 - 10.5 K/uL   RBC 4.52 4.22 - 5.81 MIL/uL   Hemoglobin 14.9 13.0 - 17.0 g/dL   HCT 28.4 13.2 - 44.0 %   MCV 90.3 80.0 - 100.0 fL   MCH 33.0 26.0 - 34.0 pg   MCHC 36.5 (H) 30.0 - 36.0 g/dL   RDW 10.2 72.5 - 36.6 %   Platelets 269 150 - 400 K/uL   nRBC 0.0 0.0 - 0.2 %    Comment: Performed at [facility], [address], [place], [place] [address]  Urine Drug Screen, Qualitative     Status: Abnormal   Collection Time: 06/02/23 10:19 PM  Result Value Ref Range   Tricyclic, Ur Screen NONE DETECTED NONE DETECTED   Amphetamines, Ur Screen POSITIVE (A) NONE DETECTED   MDMA (Ecstasy)Ur Screen NONE DETECTED NONE DETECTED   Cocaine Metabolite,Ur [place] NONE DETECTED NONE DETECTED   Opiate, Ur Screen POSITIVE (A) NONE DETECTED   Phencyclidine (PCP) Ur S NONE DETECTED NONE DETECTED   Cannabinoid 50 Ng, Ur [place] NONE DETECTED NONE DETECTED   Barbiturates, Ur Screen NONE DETECTED NONE DETECTED   Benzodiazepine, Ur Scrn NONE DETECTED NONE DETECTED   Methadone Scn, Ur NONE DETECTED NONE DETECTED    Comment: (NOTE) Tricyclics + metabolites, urine    Cutoff 1000 ng/mL Amphetamines + metabolites, urine  Cutoff 1000 ng/mL MDMA  (Ecstasy), urine  Cutoff 500 ng/mL Cocaine Metabolite, urine          Cutoff 300 ng/mL Opiate + metabolites, urine        Cutoff 300 ng/mL Phencyclidine (PCP), urine         Cutoff 25 ng/mL Cannabinoid, urine                 Cutoff 50 ng/mL Barbiturates + metabolites, urine  Cutoff 200 ng/mL Benzodiazepine, urine              Cutoff 200 ng/mL Methadone, urine                   Cutoff 300 ng/mL  The urine drug screen provides only a preliminary, unconfirmed analytical test result and should not be used for non-medical purposes. Clinical consideration and professional judgment should be applied to any positive drug screen result due to possible interfering substances. A more specific alternate chemical method must be used in order to obtain a confirmed analytical result. Gas chromatography / mass spectrometry (GC/MS) is the preferred confirm atory method. Performed at [facility], [address] [place], [place] [address]   Acetaminophen level     Status: None   Collection Time: 06/03/23  2:43 AM  Result Value Ref Range   Acetaminophen (Tylenol), Serum 10 10 - 30 ug/mL    Comment: (NOTE) Therapeutic concentrations vary significantly. A range of 10-30 ug/mL  may be an effective concentration for many patients. However, some  are best treated at concentrations outside of this range. Acetaminophen concentrations >150 ug/mL at 4 hours after ingestion  and >50 ug/mL at 12 hours after ingestion are often associated with  toxic reactions.  Performed at [facility], [address] [place], [place] [address]     No current facility-administered medications for this encounter.   Current Outpatient Medications  Medication Sig Dispense Refill   Adapalene-Benzoyl Peroxide 0.3-2.5 % GEL apply a small amount to affected area once a day for acne     emtricitabine-tenofovir (TRUVADA) 200-300 MG tablet Take 1 tablet by mouth daily.     hyoscyamine (LEVBID)  0.375 MG 12 hr tablet Take 0.375 mg by mouth 2 (two) times daily.     QUVIVIQ 50 MG TABS Take 50 mg by mouth daily.     tirzepatide (MOUNJARO) 15 MG/0.5ML Pen Inject 15 mg under the skin every seven (7) days for diabetes.     Azelaic Acid 15 % gel Apply twice daily to affected area. (Patient not taking: Reported on 12/30/2022) 50 g 2   finasteride (PROPECIA) 1 MG tablet TAKE 1 TABLET BY MOUTH EVERY DAY (Patient not taking: Reported on 06/03/2023) 90 tablet 3   minoxidil (LONITEN) 2.5 MG tablet Take 1 tablet (2.5 mg total) by mouth daily. (Patient not taking: Reported on 06/03/2023) 90 tablet 3   ORACEA 40 MG capsule TAKE 1 CAPSULE (40 MG TOTAL) BY MOUTH EVERY MORNING. (Patient not taking: Reported on 06/03/2023) 90 capsule 1   Sulfacetamide Sodium-Sulfur (AVAR-E EMOLLIENT) 10-5 % CREA Apply a thin coat to the face QD. (Patient not taking: Reported on 12/30/2022) 57 g 3   tacrolimus (PROTOPIC) 0.1 % ointment APPLY TO AFFECTED AREA ONCE DAILY (Patient not taking: Reported on 12/30/2022) 60 g 0   triamcinolone ointment (KENALOG) 0.1 % APPLY A SMALL AMOUNT TO AFFECTED AREA TWICE A DAY AS NEEDED (Patient not taking: Reported on 12/30/2022)      Musculoskeletal: Strength & Muscle Tone: within normal limits Gait & Station:  normal Patient leans: N/A   Psychiatric Specialty Exam:  Presentation  General Appearance: Appropriate for Environment; Other (comment) (paper scrubs)  Eye Contact:Good  Speech:Clear and Coherent  Speech Volume:Normal  Handedness:Right   Mood and Affect  Mood:Anxious; Depressed  Affect:Congruent   Thought Process  Thought Processes:Coherent  Descriptions of Associations:Intact  Orientation:Full (Time, Place and Person)  Thought Content:Logical  History of Schizophrenia/Schizoaffective disorder:No  Duration of Psychotic Symptoms:No data recorded Hallucinations:Hallucinations: None  Ideas of Reference:None  Suicidal Thoughts:Suicidal Thoughts: No (SI  attempt on 06/02/2023)  Homicidal Thoughts:Homicidal Thoughts: No   Sensorium  Memory:Recent Fair  Judgment:Poor  Insight:Fair   Executive Functions  Concentration:Fair  Attention Span:Fair  Recall:Fair  Fund of Knowledge:Good  Language:Fair   Psychomotor Activity  Psychomotor Activity:Psychomotor Activity: Normal   Assets  Assets:Desire for Improvement   Sleep  Sleep:Sleep: Fair   Physical Exam: Physical Exam Vitals and nursing note reviewed.  Cardiovascular:     Rate and Rhythm: Normal rate and regular rhythm.  Psychiatric:        Mood and Affect: Mood normal.        Behavior: Behavior normal.        Thought Content: Thought content normal.    Review of Systems  Psychiatric/Behavioral:  Negative for depression. The patient is nervous/anxious.   All other systems reviewed and are negative.  Blood pressure 123/88, pulse 85, temperature 98.2 F (36.8 C), temperature source Oral, resp. rate 18, height 5\' 8"  (1.727 m), weight 68 kg, SpO2 98%. Body mass index is 22.81 kg/m.  Treatment Plan Summary: Daily contact with patient to assess and evaluate symptoms and progress in treatment and Medication management  Disposition: Recommend psychiatric Inpatient admission when medically cleared.  Patient to restart Lexapo 10 mg daily CSW to seek inpatient admission admission    Oneta Rack, NP 06/03/2023 10:15 AM

## 2023-06-03 NOTE — ED Notes (Signed)
During morning assessment pt stated that he was "remorseful for taking those 4 pills"  Pt stated that he did not have actual intent as he had "a lot of pills I could have taken, but I didn't"  Pt stated that he is very stressed d/t family stressors.  Cont to monitor as ordered

## 2023-06-03 NOTE — ED Notes (Signed)
Pt given lunch tray and beverage

## 2023-06-04 ENCOUNTER — Inpatient Hospital Stay
Admission: AD | Admit: 2023-06-04 | Discharge: 2023-06-07 | DRG: 885 | Disposition: A | Discharge status: Home / Self Care | Payer: BC Managed Care – PPO | Source: Intra-hospital | Attending: Psychiatry | Admitting: Psychiatry

## 2023-06-04 ENCOUNTER — Encounter: Payer: Self-pay | Admitting: Psychiatry

## 2023-06-04 ENCOUNTER — Other Ambulatory Visit: Payer: Self-pay

## 2023-06-04 DIAGNOSIS — Z79899 Other long term (current) drug therapy: Secondary | ICD-10-CM | POA: Diagnosis not present

## 2023-06-04 DIAGNOSIS — Z9049 Acquired absence of other specified parts of digestive tract: Secondary | ICD-10-CM

## 2023-06-04 DIAGNOSIS — R45851 Suicidal ideations: Secondary | ICD-10-CM | POA: Diagnosis present

## 2023-06-04 DIAGNOSIS — Z888 Allergy status to other drugs, medicaments and biological substances status: Secondary | ICD-10-CM

## 2023-06-04 DIAGNOSIS — G4700 Insomnia, unspecified: Secondary | ICD-10-CM | POA: Diagnosis present

## 2023-06-04 DIAGNOSIS — F332 Major depressive disorder, recurrent severe without psychotic features: Secondary | ICD-10-CM | POA: Diagnosis present

## 2023-06-04 DIAGNOSIS — F909 Attention-deficit hyperactivity disorder, unspecified type: Secondary | ICD-10-CM | POA: Diagnosis present

## 2023-06-04 DIAGNOSIS — F419 Anxiety disorder, unspecified: Secondary | ICD-10-CM | POA: Diagnosis present

## 2023-06-04 DIAGNOSIS — T1491XA Suicide attempt, initial encounter: Secondary | ICD-10-CM | POA: Insufficient documentation

## 2023-06-04 MED ORDER — HALOPERIDOL LACTATE 5 MG/ML IJ SOLN: 5.0000 mg | Freq: Three times a day (TID) | INTRAMUSCULAR | Status: DC | PRN

## 2023-06-04 MED ORDER — ACETAMINOPHEN 325 MG PO TABS: 650.0000 mg | ORAL_TABLET | Freq: Four times a day (QID) | ORAL | Status: DC | PRN

## 2023-06-04 MED ORDER — TRAZODONE HCL 50 MG PO TABS: 50.0000 mg | ORAL_TABLET | Freq: Every evening | ORAL | Status: DC | PRN

## 2023-06-04 MED ORDER — HALOPERIDOL 5 MG PO TABS: 5.0000 mg | ORAL_TABLET | Freq: Three times a day (TID) | ORAL | Status: DC | PRN

## 2023-06-04 MED ORDER — MAGNESIUM HYDROXIDE 400 MG/5ML PO SUSP: 30.0000 mL | Freq: Every day | ORAL | Status: DC | PRN

## 2023-06-04 MED ORDER — HALOPERIDOL LACTATE 5 MG/ML IJ SOLN: 10.0000 mg | Freq: Three times a day (TID) | INTRAMUSCULAR | Status: DC | PRN

## 2023-06-04 MED ORDER — TRAZODONE HCL 50 MG PO TABS
150.0000 mg | ORAL_TABLET | Freq: Every day | ORAL | Status: DC
  Administered 2023-06-04 – 2023-06-06 (×3): 150 mg via ORAL
  Filled 2023-06-04 (×3): qty 1

## 2023-06-04 MED ORDER — ALUM & MAG HYDROXIDE-SIMETH 200-200-20 MG/5ML PO SUSP: 30.0000 mL | ORAL | Status: DC | PRN

## 2023-06-04 MED ORDER — LORAZEPAM 2 MG/ML IJ SOLN: 2.0000 mg | Freq: Three times a day (TID) | INTRAMUSCULAR | Status: DC | PRN

## 2023-06-04 MED ORDER — DIPHENHYDRAMINE HCL 50 MG/ML IJ SOLN: 50.0000 mg | Freq: Three times a day (TID) | INTRAMUSCULAR | Status: DC | PRN

## 2023-06-04 MED ORDER — DIPHENHYDRAMINE HCL 25 MG PO CAPS: 50.0000 mg | ORAL_CAPSULE | Freq: Three times a day (TID) | ORAL | Status: DC | PRN

## 2023-06-04 MED ORDER — TRAZODONE HCL 50 MG PO TABS: 50.0000 mg | ORAL_TABLET | Freq: Every day | ORAL | Status: DC

## 2023-06-04 MED ORDER — ENSURE ENLIVE PO LIQD
1.0000 | Freq: Two times a day (BID) | ORAL | Status: DC
  Administered 2023-06-04 – 2023-06-07 (×4): 237 mL via ORAL

## 2023-06-04 MED ORDER — BUPROPION HCL 75 MG PO TABS
150.0000 mg | ORAL_TABLET | Freq: Every day | ORAL | Status: DC
  Administered 2023-06-05 – 2023-06-07 (×3): 150 mg via ORAL
  Filled 2023-06-04 (×4): qty 2

## 2023-06-04 MED ORDER — HYDROXYZINE HCL 50 MG PO TABS
50.0000 mg | ORAL_TABLET | Freq: Four times a day (QID) | ORAL | Status: DC | PRN
  Administered 2023-06-04 – 2023-06-06 (×3): 50 mg via ORAL
  Filled 2023-06-04 (×4): qty 1

## 2023-06-04 MED ORDER — ESCITALOPRAM OXALATE 10 MG PO TABS
10.0000 mg | ORAL_TABLET | Freq: Every day | ORAL | Status: DC
  Administered 2023-06-04: 10 mg via ORAL
  Filled 2023-06-04: qty 1

## 2023-06-04 NOTE — Treatment Plan (Signed)
Patient will benefit from ongoing skilled PT services in Initial Treatment Plan 06/04/2023 2:32 AM Lawrence Robles NWG:[id number]    PATIENT STRESSORS: Financial difficulties   Marital or family conflict   Occupational concerns     PATIENT STRENGTHS: Active sense of humor  Average or above average intelligence    PATIENT IDENTIFIED PROBLEMS:                      DISCHARGE CRITERIA:  Ability to meet basic life and health needs Improved stabilization in mood, thinking, and/or behavior  PRELIMINARY DISCHARGE PLAN: Outpatient therapy  PATIENT/FAMILY INVOLVEMENT: This treatment plan has been presented to and reviewed with the patient, Lawrence Robles, and/or family member. The patient and family have been given the opportunity to ask questions and make suggestions.  Roderic Lammert, RN 06/04/2023, 2:32 AMto continue to advance safe functional mobility, address ongoing impairments in, and minimize fall risk.

## 2023-06-04 NOTE — Progress Notes (Signed)
   06/04/23 1031  Psych Admission Type (Psych Patients Only)  Admission Status Involuntary  Psychosocial Assessment  Patient Complaints None  Eye Contact Brief  Facial Expression Flat  Affect Flat  Speech Logical/coherent  Interaction Minimal  Motor Activity Slow  Appearance/Hygiene In scrubs  Behavior Characteristics Appropriate to situation;Cooperative  Mood Pleasant  Thought Process  Coherency WDL  Content WDL  Delusions None reported or observed  Perception WDL  Hallucination None reported or observed  Judgment WDL  Confusion WDL  Danger to Self  Current suicidal ideation? Denies  Agreement Not to Harm Self Yes  Description of Agreement verbal  Danger to Others  Danger to Others None reported or observed   Attends all groups. Denies SI/HI/AVH.

## 2023-06-04 NOTE — Group Note (Signed)
[facility] LCSW Group Therapy Note   Group Date: 06/04/2023 Start Time: 1300 End Time: 1400   Type of Therapy/Topic:  Group Therapy:  Emotion Regulation  Participation Level:  Active   Mood:  Description of Group:    The purpose of this group is to assist patients in learning to regulate negative emotions and experience positive emotions. Patients will be guided to discuss ways in which they have been vulnerable to their negative emotions. These vulnerabilities will be juxtaposed with experiences of positive emotions or situations, and patients challenged to use positive emotions to combat negative ones. Special emphasis will be placed on coping with negative emotions in conflict situations, and patients will process healthy conflict resolution skills.  Therapeutic Goals: Patient will identify two positive emotions or experiences to reflect on in order to balance out negative emotions:  Patient will label two or more emotions that they find the most difficult to experience:  Patient will be able to demonstrate positive conflict resolution skills through discussion or role plays:   Summary of Patient Progress:  During group patient was present and attentive to peers and group facilitator. While the patient did not speak much or offer insight, the patient offered gentle and respectful presence. Patient was empathetic to peers. Patient did well adding to the positive group dynamic and receiving feedback from group facilitator and peers.   Therapeutic Modalities:   Cognitive Behavioral Therapy Feelings Identification Dialectical Behavioral Therapy   Lowry Ram, LCSW

## 2023-06-04 NOTE — [facility] Suicide Risk Assessment (Signed)
[facility] INPATIENT:  Family/Significant Other Suicide Prevention Education  Suicide Prevention Education:  Contact Attempts: Lovie Macadamia, friend, [phone] has been identified by the patient as the family member/significant other with whom the patient will be residing, and identified as the person(s) who will aid the patient in the event of a mental health crisis.  With written consent from the patient, two attempts were made to provide suicide prevention education, prior to and/or following the patient's discharge.  We were unsuccessful in providing suicide prevention education.  A suicide education pamphlet was given to the patient to share with family/significant other.  Date and time of first attempt:06/04/2023 at 11:42 AM Date and time of second attempt: Second attempt is needed.   Lowry Ram 06/04/2023, 2:21 PM

## 2023-06-04 NOTE — Group Note (Signed)
Date:  06/04/2023 Time:  5:26 PM  Group Topic/Focus:  Crisis Planning:   The purpose of this group is to help patients create a crisis plan for use upon discharge or in the future, as needed.    Participation Level:  Did Not Attend   Mary Sella Takako Minckler 06/04/2023, 5:26 PM

## 2023-06-04 NOTE — Progress Notes (Signed)
Pt is alert and oriented X4. His affect is flat/ Mood congruent. He denies nay anxiety or depression , no pain at this time. Pt denies SI/HI/AVH at this time. Pt mentioned hx of type two diabetes.   Pt mentioned being stressed out because mum recently got out of jail and was moving back home . Pt lives with Grandma .When ask who was his support system pt stated " I guess friends , It not my Grandma , she is going to allow my mum to move in , and I don't want anything to do with my mum. She is manipulative, and she has been in and out of my life so many times. ........." I probably go live with a friend when I get discharged. "  No previous psych hospitalization, Already got his flu shot. Denied alcohol and tobacco use . Asked when he could get discharged

## 2023-06-04 NOTE — BHH Suicide Risk Assessment (Signed)
[facility] Admission Suicide Risk Assessment   Nursing information obtained from:  Patient Demographic factors:  Male Current Mental Status:  Suicidal ideation indicated by patient Loss Factors:  Loss of significant relationship (Mom moving to grandma'd house , pt does not want to have a relationship with mum.) Historical Factors:  Prior suicide attempts Risk Reduction Factors:  Employed  Total Time spent with patient: 2 hours Principal Problem: MDD (major depressive disorder), recurrent episode, severe (HCC) Diagnosis:  Principal Problem:   MDD (major depressive disorder), recurrent episode, severe (HCC)  Subjective Data: 22 year old Hispanic male, presents for inpatient behavioral health treatment under involuntary commitment (IVC) following suicidal ideation (SI) and an attempt to overdose on pain medication. He texted a friend stating he was "tired of it all" and made a plan to end his life. He reports feeling "overwhelmed and trapped" due to family dynamics, particularly frustration with his grandmother and the recent reentry of his mother into their lives.he patient states this is his first suicide attempt but has a history of self-harm via cutting, with the last incident occurring one week prior. He reports a history of depression since 2020, previously managed with medications but has been without consistent care since his psychiatrist's practice closed. He denies illicit drug use or substance abuse history.Family dynamics: Resides with his grandmother, who allows his recently released mother to return home, disrupting his sense of safety. Lack of psychiatric care and medication management due to the closure of his prior provider's practice. Recent suicide attempt and history of self-harm. Lack of consistent medication or therapeutic support. Limited trust in family and reluctance to allow collateral contac Continued Clinical Symptoms:  Alcohol Use Disorder Identification Test Final Score  (AUDIT): 0 The "Alcohol Use Disorders Identification Test", Guidelines for Use in Primary Care, Second Edition.  World Science writer [facility]). Score between 0-7:  no or low risk or alcohol related problems. Score between 8-15:  moderate risk of alcohol related problems. Score between 16-19:  high risk of alcohol related problems. Score 20 or above:  warrants further diagnostic evaluation for alcohol dependence and treatment.   CLINICAL FACTORS:   {Clinical Factors:22706}   Musculoskeletal: Strength & Muscle Tone: {desc; muscle tone:32375} Gait & Station: {PE GAIT ED WUJW:11914} Patient leans: {Patient Leans:21022755}  Psychiatric Specialty Exam:  Presentation  General Appearance:  Appropriate for Environment; Other (comment) (paper scrubs)  Eye Contact: Good  Speech: Clear and Coherent  Speech Volume: Normal  Handedness: Right   Mood and Affect  Mood: Anxious; Depressed  Affect: Congruent   Thought Process  Thought Processes: Coherent  Descriptions of Associations:Intact  Orientation:Full (Time, Place and Person)  Thought Content:Logical  History of Schizophrenia/Schizoaffective disorder:No  Duration of Psychotic Symptoms:No data recorded Hallucinations:Hallucinations: None  Ideas of Reference:None  Suicidal Thoughts:Suicidal Thoughts: No (SI attempt on 06/02/2023)  Homicidal Thoughts:Homicidal Thoughts: No   Sensorium  Memory: Recent Fair  Judgment: Poor  Insight: Fair   Chartered certified accountant: Fair  Attention Span: Fair  Recall: Fair  Fund of Knowledge: Good  Language: Fair   Psychomotor Activity  Psychomotor Activity: Psychomotor Activity: Normal   Assets  Assets: Desire for Improvement   Sleep  Sleep: Sleep: Fair    Physical Exam: Physical Exam ROS Blood pressure 113/81, pulse (!) 102, temperature 97.9 F (36.6 C), resp. rate 17, height 5\' 8"  (1.727 m), weight 67.6 kg, SpO2 100%. Body  mass index is 22.66 kg/m.   COGNITIVE FEATURES THAT CONTRIBUTE TO RISK:  {Cognitive Features:304700251}    SUICIDE RISK:   {  BHH SUICIDE RISK:22704}  PLAN OF CARE: ***  I certify that inpatient services furnished can reasonably be expected to improve the patient's condition.   Myriam Forehand, NP 06/04/2023, 5:53 PM

## 2023-06-04 NOTE — Plan of Care (Signed)

## 2023-06-04 NOTE — BH IP Treatment Plan (Signed)
Interdisciplinary Treatment and Diagnostic Plan Update  06/04/2023 Time of Session: 9:31AM Lawrence Robles MRN: [id number]  Principal Diagnosis: MDD (major depressive disorder), recurrent episode, severe (HCC)  Secondary Diagnoses: Principal Problem:   MDD (major depressive disorder), recurrent episode, severe (HCC)   Current Medications:  Current Facility-Administered Medications  Medication Dose Route Frequency Provider Last Rate Last Admin   acetaminophen (TYLENOL) tablet 650 mg  650 mg Oral Q6H PRN Oneta Rack, NP       alum & mag hydroxide-simeth (MAALOX/MYLANTA) 200-200-20 MG/5ML suspension 30 mL  30 mL Oral Q4H PRN Oneta Rack, NP       haloperidol (HALDOL) tablet 5 mg  5 mg Oral TID PRN Oneta Rack, NP       And   diphenhydrAMINE (BENADRYL) capsule 50 mg  50 mg Oral TID PRN Oneta Rack, NP       haloperidol lactate (HALDOL) injection 5 mg  5 mg Intramuscular TID PRN Oneta Rack, NP       And   diphenhydrAMINE (BENADRYL) injection 50 mg  50 mg Intramuscular TID PRN Oneta Rack, NP       And   LORazepam (ATIVAN) injection 2 mg  2 mg Intramuscular TID PRN Oneta Rack, NP       haloperidol lactate (HALDOL) injection 10 mg  10 mg Intramuscular TID PRN Oneta Rack, NP       And   diphenhydrAMINE (BENADRYL) injection 50 mg  50 mg Intramuscular TID PRN Oneta Rack, NP       And   LORazepam (ATIVAN) injection 2 mg  2 mg Intramuscular TID PRN Oneta Rack, NP       escitalopram (LEXAPRO) tablet 10 mg  10 mg Oral Daily Oneta Rack, NP   10 mg at 06/04/23 0855   magnesium hydroxide (MILK OF MAGNESIA) suspension 30 mL  30 mL Oral Daily PRN Oneta Rack, NP       traZODone (DESYREL) tablet 50 mg  50 mg Oral QHS PRN Oneta Rack, NP       PTA Medications: Medications Prior to Admission  Medication Sig Dispense Refill Last Dose   Adapalene-Benzoyl Peroxide 0.3-2.5 % GEL apply a small amount to affected area once a day for acne      Azelaic  Acid 15 % gel Apply twice daily to affected area. (Patient not taking: Reported on 12/30/2022) 50 g 2    emtricitabine-tenofovir (TRUVADA) 200-300 MG tablet Take 1 tablet by mouth daily.      finasteride (PROPECIA) 1 MG tablet TAKE 1 TABLET BY MOUTH EVERY DAY (Patient not taking: Reported on 06/03/2023) 90 tablet 3    hyoscyamine (LEVBID) 0.375 MG 12 hr tablet Take 0.375 mg by mouth 2 (two) times daily.      minoxidil (LONITEN) 2.5 MG tablet Take 1 tablet (2.5 mg total) by mouth daily. (Patient not taking: Reported on 06/03/2023) 90 tablet 3    ORACEA 40 MG capsule TAKE 1 CAPSULE (40 MG TOTAL) BY MOUTH EVERY MORNING. (Patient not taking: Reported on 06/03/2023) 90 capsule 1    QUVIVIQ 50 MG TABS Take 50 mg by mouth daily.      Sulfacetamide Sodium-Sulfur (AVAR-E EMOLLIENT) 10-5 % CREA Apply a thin coat to the face QD. (Patient not taking: Reported on 12/30/2022) 57 g 3    tacrolimus (PROTOPIC) 0.1 % ointment APPLY TO AFFECTED AREA ONCE DAILY (Patient not taking: Reported on 12/30/2022) 60 g 0  tirzepatide (MOUNJARO) 15 MG/0.5ML Pen Inject 15 mg under the skin every seven (7) days for diabetes.      triamcinolone ointment (KENALOG) 0.1 % APPLY A SMALL AMOUNT TO AFFECTED AREA TWICE A DAY AS NEEDED (Patient not taking: Reported on 12/30/2022)       Patient Stressors: Financial difficulties   Marital or family conflict   Occupational concerns    Patient Strengths: Active sense of humor  Average or above average intelligence   Treatment Modalities: Medication Management, Group therapy, Case management,  1 to 1 session with clinician, Psychoeducation, Recreational therapy.   Physician Treatment Plan for Primary Diagnosis: MDD (major depressive disorder), recurrent episode, severe (HCC) Long Term Goal(s):     Short Term Goals:    Medication Management: Evaluate patient's response, side effects, and tolerance of medication regimen.  Therapeutic Interventions: 1 to 1 sessions, Unit Group sessions  and Medication administration.  Evaluation of Outcomes: Not Met  Physician Treatment Plan for Secondary Diagnosis: Principal Problem:   MDD (major depressive disorder), recurrent episode, severe (HCC)  Long Term Goal(s):     Short Term Goals:       Medication Management: Evaluate patient's response, side effects, and tolerance of medication regimen.  Therapeutic Interventions: 1 to 1 sessions, Unit Group sessions and Medication administration.  Evaluation of Outcomes: Not Met   RN Treatment Plan for Primary Diagnosis: MDD (major depressive disorder), recurrent episode, severe (HCC) Long Term Goal(s): Knowledge of disease and therapeutic regimen to maintain health will improve  Short Term Goals: Ability to remain free from injury will improve, Ability to verbalize frustration and anger appropriately will improve, Ability to demonstrate self-control, Ability to participate in decision making will improve, Ability to verbalize feelings will improve, Ability to disclose and discuss suicidal ideas, and Ability to identify and develop effective coping behaviors will improve  Medication Management: RN will administer medications as ordered by provider, will assess and evaluate patient's response and provide education to patient for prescribed medication. RN will report any adverse and/or side effects to prescribing provider.  Therapeutic Interventions: 1 on 1 counseling sessions, Psychoeducation, Medication administration, Evaluate responses to treatment, Monitor vital signs and CBGs as ordered, Perform/monitor CIWA, COWS, AIMS and Fall Risk screenings as ordered, Perform wound care treatments as ordered.  Evaluation of Outcomes: Not Met   LCSW Treatment Plan for Primary Diagnosis: MDD (major depressive disorder), recurrent episode, severe (HCC) Long Term Goal(s): Safe transition to appropriate next level of care at discharge, Engage patient in therapeutic group addressing interpersonal  concerns.  Short Term Goals: Engage patient in aftercare planning with referrals and resources, Increase social support, Increase ability to appropriately verbalize feelings, Increase emotional regulation, Facilitate acceptance of mental health diagnosis and concerns, Facilitate patient progression through stages of change regarding substance use diagnoses and concerns, Identify triggers associated with mental health/substance abuse issues, and Increase skills for wellness and recovery  Therapeutic Interventions: Assess for all discharge needs, 1 to 1 time with Social worker, Explore available resources and support systems, Assess for adequacy in community support network, Educate family and significant other(s) on suicide prevention, Complete Psychosocial Assessment, Interpersonal group therapy.  Evaluation of Outcomes: Not Met   Progress in Treatment: Attending groups: Yes. and No. Participating in groups: Yes. and No. Taking medication as prescribed: Yes. Toleration medication: Yes. Family/Significant other contact made: No, will contact:  CSW to contact once permission is granted.  Patient understands diagnosis: Yes. Discussing patient identified problems/goals with staff: Yes. Medical problems stabilized or resolved: Yes. Denies  suicidal/homicidal ideation: Yes. Issues/concerns per patient self-inventory: Yes. Other: None  New problem(s) identified: No, Describe:  None  New Short Term/Long Term Goal(s):detox, elimination of symptoms of psychosis, medication management for mood stabilization; elimination of SI thoughts; development of comprehensive mental wellness/sobriety plan.    Patient Goals:  "To get better as I can so I can go."  Discharge Plan or Barriers: CSW to assist patient in the development of appropriate discharge plan.   Reason for Continuation of Hospitalization: Anxiety Depression Suicidal ideation  Estimated Length of Stay:1-7 days.   Last 3 [place] Suicide  Severity Risk Score: Flowsheet Row Admission (Current) from 06/04/2023 in [facility]PAT[facility]AL MEDICINE ED from 06/02/2023 in [facility] Emergency Department at [facility]  C-SSRS RISK CATEGORY High Risk High Risk       Last PHQ 2/9 Scores:     No data to display          Scribe for Treatment Team: Lowry Ram, LCSW 06/04/2023 10:03 AM
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
## 2023-06-04 NOTE — Group Note (Signed)
Date:  06/04/2023 Time:  5:30 PM  Group Topic/Focus:  Activity Group:  The focus of the group is to promote activity for the patients and encourage them to go outside in the courtyard and get some fresh air and some exercise.    Participation Level:  Did Not Attend   Lawrence Robles 06/04/2023, 5:30 PM

## 2023-06-05 DIAGNOSIS — T1491XA Suicide attempt, initial encounter: Secondary | ICD-10-CM | POA: Insufficient documentation

## 2023-06-05 DIAGNOSIS — F332 Major depressive disorder, recurrent severe without psychotic features: Secondary | ICD-10-CM | POA: Diagnosis not present

## 2023-06-05 DIAGNOSIS — F909 Attention-deficit hyperactivity disorder, unspecified type: Secondary | ICD-10-CM | POA: Insufficient documentation

## 2023-06-05 MED ORDER — HYOSCYAMINE SULFATE ER 0.375 MG PO TB12
0.3750 mg | ORAL_TABLET | Freq: Two times a day (BID) | ORAL | Status: DC
  Administered 2023-06-05 – 2023-06-07 (×4): 0.375 mg via ORAL
  Filled 2023-06-05 (×5): qty 1

## 2023-06-05 NOTE — Group Note (Signed)
Date:  06/05/2023 Time:  8:33 PM  Group Topic/Focus:  Wrap-Up Group:   The focus of this group is to help patients review their daily goal of treatment and discuss progress on daily workbooks.    Participation Level:  Active  Participation Quality:  Appropriate and Attentive  Affect:  Appropriate  Cognitive:  Alert and Appropriate  Insight: Appropriate  Engagement in Group:  Engaged  Modes of Intervention:  Discussion  Additional Comments:     [name],Seda Kronberg E 06/05/2023, 8:33 PM

## 2023-06-05 NOTE — Group Note (Signed)
Date:  06/05/2023 Time:  5:23 PM  Group Topic/Focus:  Rediscovering Joy:   The focus of this group is to explore various ways to relieve stress in a positive manner.    Participation Level:  Active  Participation Quality:  Appropriate  Affect:  Appropriate  Cognitive:  Appropriate  Insight: Appropriate  Engagement in Group:  Engaged  Modes of Intervention:  Socialization  Additional Comments:    Wilford Corner 06/05/2023, 5:23 PM

## 2023-06-05 NOTE — [facility] Suicide Risk Assessment (Addendum)
[facility] INPATIENT:  Family/Significant Other Suicide Prevention Education  Suicide Prevention Education:  Contact Attempts: Lawrence Robles, friend, [phone],  has been identified by the patient as the family member/significant other with whom the patient will be residing, and identified as the person(s) who will aid the patient in the event of a mental health crisis.  With written consent from the patient, two attempts were made to provide suicide prevention education, prior to and/or following the patient's discharge.  We were unsuccessful in providing suicide prevention education.  A suicide education pamphlet was given to the patient to share with family/significant other.  Date and time of first attempt: 06/04/2023 at 11:42 AM  Date and time of second attempt: 06/05/23 at 3:41 pm  Lawrence Robles 06/05/2023, 4:55 PM

## 2023-06-05 NOTE — H&P (Signed)
Psychiatric Admission Assessment Adult  Patient Identification: Lawrence Robles MRN:  [id number] Date of Evaluation:  06/04/2023 Chief Complaint:  MDD (major depressive disorder), recurrent episode, severe (HCC) [F33.2] Principal Diagnosis: MDD (major depressive disorder), recurrent episode, severe (HCC) Diagnosis:  Principal Problem:   MDD (major depressive disorder), recurrent episode, severe (HCC) Active Problems:   Suicide attempt (HCC)  History of Present Illness:  22 year old Hispanic male, presents to the behavioral health unit under involuntary commitment (IVC) following a suicide attempt. Per triage notes and patient report, he texted a friend stating he was "tired of it all" and planned to take pills to end his life. He ingested a portion of his grandmother's Vicodin but did not take the full amount. This was his first suicide attempt, although he has a history of self-harm (cutting), with the most recent episode occurring one week prior.The patient states, "I've been going through a lot. I feel overwhelmed and trapped." He identifies family dynamics as a primary stressor, citing conflict with his grandmother, with whom he resides. He reports frustration that his grandmother allows his biological mother--who was recently released from jail--to come and go freely, disrupting his sense of safety at home.The patient reports a history of depression since 2020, which he describes as previously well-managed with medications and psychiatric care. However, he lost access to care three months ago after his psychiatrist's practice closed. He has since been without prescribed medications, including sleep aids, ADHD treatments, and other psychotropics. He denies any history of prior psychiatric hospitalizations.The patient works as a Pharmacologist at [employer], denies substance use disorders, but admitted to taking his grandmother's medication (Vicodin). UDS (urine drug screen) was positive for opiates and  amphetamines. He denies current suicidal ideation (SI), homicidal ideation (HI), auditory hallucinations (AH), visual hallucinations (VH), or delusions.He reports feeling better today but remains frustrated with his home environment and guarded during the interview. Associated Signs/Symptoms: Depression Symptoms:  insomnia, hopelessness, suicidal thoughts with specific plan, suicidal attempt, loss of energy/fatigue, weight loss, decreased appetite, (Hypo) Manic Symptoms:  Impulsivity, Labiality of Mood, Anxiety Symptoms:  Excessive Worry, Psychotic Symptoms:   none noted PTSD Symptoms: Negative Total Time spent with patient: 2.5 hours  Past Psychiatric History: Anxiety Depression ADHD  Is the patient at risk to self? Yes.    Has the patient been a risk to self in the past 6 months? No.  Has the patient been a risk to self within the distant past? No.  Is the patient a risk to others? No.  Has the patient been a risk to others in the past 6 months? No.  Has the patient been a risk to others within the distant past? No.   [place] Scale:  Flowsheet Row Admission (Current) from 06/04/2023 in [facility] INPATIENT BEHAVIORAL MEDICINE ED from 06/02/2023 in [facility] Emergency Department at [facility]  C-SSRS RISK CATEGORY High Risk High Risk        Prior Inpatient Therapy: No. If yes, describe none reported  Prior Outpatient Therapy: Yes.   If yes, describe [facility] outpatiet    Alcohol Screening: Patient refused Alcohol Screening Tool: Yes 1. How often do you have a drink containing alcohol?: Never 2. How many drinks containing alcohol do you have on a typical day when you are drinking?: 1 or 2 3. How often do you have six or more drinks on one occasion?: Never AUDIT-C Score: 0 4. How often during the last year have you found that you were not able to stop drinking once  you had started?: Never 5. How often during the last year have you failed to do what was normally expected from  you because of drinking?: Never 6. How often during the last year have you needed a first drink in the morning to get yourself going after a heavy drinking session?: Never 7. How often during the last year have you had a feeling of guilt of remorse after drinking?: Never 8. How often during the last year have you been unable to remember what happened the night before because you had been drinking?: Never 9. Have you or someone else been injured as a result of your drinking?: No 10. Has a relative or friend or a doctor or another health worker been concerned about your drinking or suggested you cut down?: No Alcohol Use Disorder Identification Test Final Score (AUDIT): 0 Alcohol Brief Interventions/Follow-up: Patient Refused Substance Abuse History in the last 12 months:  No. Consequences of Substance Abuse: Negative Previous Psychotropic Medications: Yes  Psychological Evaluations: No  Past Medical History: History reviewed. No pertinent past medical history.  Past Surgical History:  Procedure Laterality Date   APPENDECTOMY     Family History: History reviewed. No pertinent family history. Family Psychiatric  History: none reported Tobacco Screening:  Social History   Tobacco Use  Smoking Status Never  Smokeless Tobacco Never    BH Tobacco Counseling     Are you interested in Tobacco Cessation Medications?  No, patient refused Counseled patient on smoking cessation:  No value filed. Reason Tobacco Screening Not Completed: No value filed.       Social History:  Social History   Substance and Sexual Activity  Alcohol Use Not Currently     Social History   Substance and Sexual Activity  Drug Use Yes   Types: Other-see comments   Comment: has a history of mental illness took a some of  prescriibed medication for his condition.    Additional Social History: Marital status: Single Are you sexually active?: Yes What is your sexual orientation?: "Gay." Has your sexual  activity been affected by drugs, alcohol, medication, or emotional stress?: Patient denies. Does patient have children?: No                         Allergies:   Allergies  Allergen Reactions   Neosporin [Bacitracin-Polymyxin B] Itching   Lab Results: No results found for this or any previous visit (from the past 48 hour(s)).  Blood Alcohol level:  Lab Results  Component Value Date   ETH <10 06/02/2023     Current Medications: Current Facility-Administered Medications  Medication Dose Route Frequency Provider Last Rate Last Admin   acetaminophen (TYLENOL) tablet 650 mg  650 mg Oral Q6H PRN Oneta Rack, NP       alum & mag hydroxide-simeth (MAALOX/MYLANTA) 200-200-20 MG/5ML suspension 30 mL  30 mL Oral Q4H PRN Oneta Rack, NP       buPROPion [facility]) tablet 150 mg  150 mg Oral Daily Myriam Forehand, NP   150 mg at 06/05/23 8295   haloperidol (HALDOL) tablet 5 mg  5 mg Oral TID PRN Oneta Rack, NP       And   diphenhydrAMINE (BENADRYL) capsule 50 mg  50 mg Oral TID PRN Oneta Rack, NP       haloperidol lactate (HALDOL) injection 5 mg  5 mg Intramuscular TID PRN Oneta Rack, NP       And  diphenhydrAMINE (BENADRYL) injection 50 mg  50 mg Intramuscular TID PRN Oneta Rack, NP       And   LORazepam (ATIVAN) injection 2 mg  2 mg Intramuscular TID PRN Oneta Rack, NP       haloperidol lactate (HALDOL) injection 10 mg  10 mg Intramuscular TID PRN Oneta Rack, NP       And   diphenhydrAMINE (BENADRYL) injection 50 mg  50 mg Intramuscular TID PRN Oneta Rack, NP       And   LORazepam (ATIVAN) injection 2 mg  2 mg Intramuscular TID PRN Oneta Rack, NP       feeding supplement (ENSURE ENLIVE / ENSURE PLUS) liquid 237 mL  1 Bottle Oral BID BM Myriam Forehand, NP   237 mL at 06/04/23 2305   hydrOXYzine (ATARAX) tablet 50 mg  50 mg Oral Q6H PRN Myriam Forehand, NP   50 mg at 06/04/23 1731   magnesium hydroxide (MILK OF MAGNESIA) suspension 30 mL   30 mL Oral Daily PRN Oneta Rack, NP       traZODone (DESYREL) tablet 150 mg  150 mg Oral QHS Myriam Forehand, NP   150 mg at 06/04/23 2303   PTA Medications: Medications Prior to Admission  Medication Sig Dispense Refill Last Dose   Adapalene-Benzoyl Peroxide 0.3-2.5 % GEL apply a small amount to affected area once a day for acne      Azelaic Acid 15 % gel Apply twice daily to affected area. (Patient not taking: Reported on 12/30/2022) 50 g 2    emtricitabine-tenofovir (TRUVADA) 200-300 MG tablet Take 1 tablet by mouth daily.      finasteride (PROPECIA) 1 MG tablet TAKE 1 TABLET BY MOUTH EVERY DAY (Patient not taking: Reported on 06/03/2023) 90 tablet 3    hyoscyamine (LEVBID) 0.375 MG 12 hr tablet Take 0.375 mg by mouth 2 (two) times daily.      minoxidil (LONITEN) 2.5 MG tablet Take 1 tablet (2.5 mg total) by mouth daily. (Patient not taking: Reported on 06/03/2023) 90 tablet 3    ORACEA 40 MG capsule TAKE 1 CAPSULE (40 MG TOTAL) BY MOUTH EVERY MORNING. (Patient not taking: Reported on 06/03/2023) 90 capsule 1    QUVIVIQ 50 MG TABS Take 50 mg by mouth daily.      Sulfacetamide Sodium-Sulfur (AVAR-E EMOLLIENT) 10-5 % CREA Apply a thin coat to the face QD. (Patient not taking: Reported on 12/30/2022) 57 g 3    tacrolimus (PROTOPIC) 0.1 % ointment APPLY TO AFFECTED AREA ONCE DAILY (Patient not taking: Reported on 12/30/2022) 60 g 0    tirzepatide (MOUNJARO) 15 MG/0.5ML Pen Inject 15 mg under the skin every seven (7) days for diabetes.      triamcinolone ointment (KENALOG) 0.1 % APPLY A SMALL AMOUNT TO AFFECTED AREA TWICE A DAY AS NEEDED (Patient not taking: Reported on 12/30/2022)       Musculoskeletal: Strength & Muscle Tone: within normal limits Gait & Station: normal Patient leans: N/A            Psychiatric Specialty Exam:  Presentation  General Appearance:  Neat; Appropriate for Environment  Eye Contact: Good  Speech: Clear and Coherent; Normal Rate  Speech  Volume: Normal  Handedness: Right   Mood and Affect  Mood: Angry; Anxious; Irritable  Affect: Congruent   Thought Process  Thought Processes: Coherent  Duration of Psychotic Symptoms: depressive symptoms 1 month Past Diagnosis of Schizophrenia or Psychoactive disorder:  No  Descriptions of Associations:Intact  Orientation:Full (Time, Place and Person) (and siuation)  Thought Content:WDL  Hallucinations:Hallucinations: None  Ideas of Reference:None  Suicidal Thoughts:Suicidal Thoughts: No  Homicidal Thoughts:Homicidal Thoughts: No   Sensorium  Memory: Immediate Good; Immediate Fair  Judgment: Poor  Insight: Poor   Executive Functions  Concentration: Good  Attention Span: Fair  Recall: Good  Fund of Knowledge: Good  Language: Good   Psychomotor Activity  Psychomotor Activity: Psychomotor Activity: Normal   Assets  Assets: Financial Resources/Insurance; Communication Skills; Housing; Physical Health   Sleep  Sleep: Sleep: Fair Number of Hours of Sleep: 6    Physical Exam: Physical Exam Vitals and nursing note reviewed.  Constitutional:      Appearance: Normal appearance.  HENT:     Head: Normocephalic and atraumatic.     Nose: Nose normal.  Cardiovascular:     Rate and Rhythm: Normal rate.  Pulmonary:     Effort: Pulmonary effort is normal.  Musculoskeletal:        General: Normal range of motion.     Cervical back: Normal range of motion.  Neurological:     General: No focal deficit present.     Mental Status: He is alert and oriented to person, place, and time.  Psychiatric:        Attention and Perception: Attention and perception normal.        Mood and Affect: Mood is anxious. Affect is flat and angry.        Speech: Speech normal.        Behavior: Behavior normal. Behavior is cooperative.        Thought Content: Thought content normal.        Cognition and Memory: Cognition and memory normal.        Judgment:  Judgment is impulsive.    Review of Systems  Psychiatric/Behavioral:  Positive for suicidal ideas. The patient is nervous/anxious.   All other systems reviewed and are negative.  Blood pressure (!) 87/62, pulse 98, temperature 98.4 F (36.9 C), resp. rate 18, height 5\' 8"  (1.727 m), weight 67.6 kg, SpO2 98%. Body mass index is 22.66 kg/m.  Treatment Plan Summary: Daily contact with patient to assess and evaluate symptoms and progress in treatment and Medication management Wellbutrin (Bupropion XL) 150 mg Trazodone 100 mg at bedtime  Vistaril (Hydroxyzine) 50 mg as needed (PRN) for anxiety and agitation.  Observation Level/Precautions:  Continuous Observation 15 minute checks  Laboratory:   none  Psychotherapy:    Medications:    Consultations:    Discharge Concerns:    Estimated LOS:  Other:     Physician Treatment Plan for Primary Diagnosis: MDD (major depressive disorder), recurrent episode, severe (HCC) Long Term Goal(s): Improvement in symptoms so as ready for discharge  Short Term Goals: Ability to identify changes in lifestyle to reduce recurrence of condition will improve, Ability to verbalize feelings will improve, Ability to disclose and discuss suicidal ideas, Ability to demonstrate self-control will improve, Ability to identify and develop effective coping behaviors will improve, Ability to maintain clinical measurements within normal limits will improve, Compliance with prescribed medications will improve, and Ability to identify triggers associated with substance abuse/mental health issues will improve  Physician Treatment Plan for Secondary Diagnosis: Principal Problem:   MDD (major depressive disorder), recurrent episode, severe (HCC) Active Problems:   Suicide attempt [facility])  Long Term Goal(s): Improvement in symptoms so as ready for discharge  Short Term Goals: Ability to identify changes in lifestyle to  reduce recurrence of condition will improve, Ability to  verbalize feelings will improve, Ability to disclose and discuss suicidal ideas, Ability to demonstrate self-control will improve, Ability to identify and develop effective coping behaviors will improve, Ability to maintain clinical measurements within normal limits will improve, Compliance with prescribed medications will improve, and Ability to identify triggers associated with substance abuse/mental health issues will improve  I certify that inpatient services furnished can reasonably be expected to improve the patient's condition.    Myriam Forehand, NP 11/30/20241:44 PM

## 2023-06-05 NOTE — Progress Notes (Signed)
[facility] MD Progress Note  06/05/2023 3:22 PM Lawrence Robles  MRN:  [id number] Subjective:   22 year old Hispanic male, reports, "I slept pretty good last night, despite my circumstances." The patient expresses concern about being placed on Truvada, stating it is for prevention and does not wish to take it. Additionally, the patient requests medication for irritable bowel syndrome (IBS), mentioning symptoms but not elaborating further. The patient denies suicidal ideation (SI), homicidal ideation (HI), delusions, or hallucination. Principal Problem: MDD (major depressive disorder), recurrent episode, severe (HCC) Diagnosis: Principal Problem:   MDD (major depressive disorder), recurrent episode, severe (HCC) Active Problems:   Suicide attempt (HCC)  Total Time spent with patient: 45 minutes  Past Psychiatric History: Anxiety Depression ADHD    Past Medical History: History reviewed. No pertinent past medical history.  Past Surgical History:  Procedure Laterality Date   APPENDECTOMY     Family History: History reviewed. No pertinent family history. Family Psychiatric  History: none reported Social History:  Social History   Substance and Sexual Activity  Alcohol Use Not Currently     Social History   Substance and Sexual Activity  Drug Use Yes   Types: Other-see comments   Comment: has a history of mental illness took a some of  prescriibed medication for his condition.    Social History   Socioeconomic History   Marital status: Single    Spouse name: Not on file   Number of children: Not on file   Years of education: Not on file   Highest education level: Not on file  Occupational History   Not on file  Tobacco Use   Smoking status: Never   Smokeless tobacco: Never  Vaping Use   Vaping status: Never Used  Substance and Sexual Activity   Alcohol use: Not Currently   Drug use: Yes    Types: Other-see comments    Comment: has a history of mental illness took a some of   prescriibed medication for his condition.   Sexual activity: Not on file  Other Topics Concern   Not on file  Social History Narrative   Not on file   Social Determinants of Health   Financial Resource Strain: Low Risk  (03/05/2021)   Received from [facility], [facility]   Overall Financial Resource Strain (CARDIA)    Difficulty of Paying Living Expenses: Not hard at all  Food Insecurity: No Food Insecurity (06/04/2023)   Hunger Vital Sign    Worried About Running Out of Food in the Last Year: Never true    Ran Out of Food in the Last Year: Never true  Transportation Needs: No Transportation Needs (06/04/2023)   PRAPARE - Administrator, Civil Service (Medical): No    Lack of Transportation (Non-Medical): No  Physical Activity: Inactive (03/05/2021)   Received from [facility], [facility]   Exercise Vital Sign    Days of Exercise per Week: 0 days    Minutes of Exercise per Session: 0 min  Stress: No Stress Concern Present (03/05/2021)   Received from [facility], [facility] of Occupational Health - Occupational Stress Questionnaire    Feeling of Stress : Only a little  Social Connections: Unknown (11/18/2021)   Received from [facility], [facility]   Social Network    Social Network: Not on file   Additional Social History:  Sleep: Good  Appetite:  Good  Current Medications: Current Facility-Administered Medications  Medication Dose Route Frequency Provider Last Rate Last Admin   acetaminophen (TYLENOL) tablet 650 mg  650 mg Oral Q6H PRN Oneta Rack, NP       alum & mag hydroxide-simeth (MAALOX/MYLANTA) 200-200-20 MG/5ML suspension 30 mL  30 mL Oral Q4H PRN Oneta Rack, NP       buPROPion [facility]) tablet 150 mg  150 mg Oral Daily Myriam Forehand, NP   150 mg at 06/05/23 0981   haloperidol (HALDOL) tablet 5 mg  5 mg Oral TID PRN Oneta Rack, NP       And   diphenhydrAMINE  (BENADRYL) capsule 50 mg  50 mg Oral TID PRN Oneta Rack, NP       haloperidol lactate (HALDOL) injection 5 mg  5 mg Intramuscular TID PRN Oneta Rack, NP       And   diphenhydrAMINE (BENADRYL) injection 50 mg  50 mg Intramuscular TID PRN Oneta Rack, NP       And   LORazepam (ATIVAN) injection 2 mg  2 mg Intramuscular TID PRN Oneta Rack, NP       haloperidol lactate (HALDOL) injection 10 mg  10 mg Intramuscular TID PRN Oneta Rack, NP       And   diphenhydrAMINE (BENADRYL) injection 50 mg  50 mg Intramuscular TID PRN Oneta Rack, NP       And   LORazepam (ATIVAN) injection 2 mg  2 mg Intramuscular TID PRN Oneta Rack, NP       feeding supplement (ENSURE ENLIVE / ENSURE PLUS) liquid 237 mL  1 Bottle Oral BID BM Myriam Forehand, NP   237 mL at 06/04/23 2305   hydrOXYzine (ATARAX) tablet 50 mg  50 mg Oral Q6H PRN Myriam Forehand, NP   50 mg at 06/04/23 1731   hyoscyamine (LEVBID) 0.375 MG 12 hr tablet 0.375 mg  0.375 mg Oral Q12H Myriam Forehand, NP       magnesium hydroxide (MILK OF MAGNESIA) suspension 30 mL  30 mL Oral Daily PRN Oneta Rack, NP       traZODone (DESYREL) tablet 150 mg  150 mg Oral QHS Myriam Forehand, NP   150 mg at 06/04/23 2303    Lab Results: No results found for this or any previous visit (from the past 48 hour(s)).  Blood Alcohol level:  Lab Results  Component Value Date   ETH <10 06/02/2023    Metabolic Disorder Labs: No results found for: "HGBA1C", "MPG" No results found for: "PROLACTIN" No results found for: "CHOL", "TRIG", "HDL", "CHOLHDL", "VLDL", "LDLCALC"  Physical Findings: AIMS:  , ,  ,  ,    CIWA:    COWS:     Musculoskeletal: Strength & Muscle Tone: within normal limits Gait & Station: normal Patient leans: N/A  Psychiatric Specialty Exam:  Presentation  General Appearance:  Appropriate for Environment; Meticulous; Neat  Eye Contact: Good  Speech: Clear and Coherent  Speech  Volume: Normal  Handedness: Right   Mood and Affect  Mood: Anxious  Affect: Congruent   Thought Process  Thought Processes: Coherent  Descriptions of Associations:Intact  Orientation:Full (Time, Place and Person)  Thought Content:Rumination (about past events)  History of Schizophrenia/Schizoaffective disorder:No  Duration of Psychotic Symptoms:No data recorded Hallucinations:Hallucinations: None  Ideas of Reference:None  Suicidal Thoughts:Suicidal Thoughts: No  Homicidal Thoughts:Homicidal Thoughts: No   Sensorium  Memory: Immediate Fair  Judgment: Fair  Insight: Poor   Executive Functions  Concentration: Good  Attention Span: Good  Recall: Good  Fund of Knowledge: Good  Language: Good   Psychomotor Activity  Psychomotor Activity: Psychomotor Activity: Normal   Assets  Assets: Communication Skills; Financial Resources/Insurance; Desire for Improvement; Resilience; Housing   Sleep  Sleep: Sleep: Good Number of Hours of Sleep: 7    Physical Exam: Physical Exam Vitals and nursing note reviewed.  Constitutional:      Appearance: Normal appearance.  HENT:     Head: Normocephalic and atraumatic.     Nose: Nose normal.  Pulmonary:     Effort: Pulmonary effort is normal.  Musculoskeletal:        General: Normal range of motion.     Cervical back: Normal range of motion.  Neurological:     General: No focal deficit present.     Mental Status: He is alert.  Psychiatric:        Attention and Perception: Attention and perception normal.        Mood and Affect: Mood is anxious. Affect is flat.        Speech: Speech normal.        Behavior: Behavior normal. Behavior is cooperative.        Thought Content: Thought content normal.        Cognition and Memory: Cognition and memory normal.        Judgment: Judgment is impulsive.    ROS Blood pressure (!) 87/62, pulse 98, temperature 98.4 F (36.9 C), resp. rate 18, height 5'  8" (1.727 m), weight 67.6 kg, SpO2 98%. Body mass index is 22.66 kg/m.   Treatment Plan Summary: Daily contact with patient to assess and evaluate symptoms and progress in treatment and Medication management Wellbutrin (Bupropion XL) 150 mg Trazodone 100 mg at bedtime  Vistaril (Hydroxyzine) 50 mg as needed (PRN) for anxiety and agitation. Initiate Hyoscyamine 0.125 mg as needed for IBS symptoms. Educate the patient about Truvada and explore concerns regarding its use for prevention. Provide alternatives or explanations if medically necessary. Continue supportive counseling to address emotional distress and coping strategies. Provide psychoeducation about the importance of medication adherence and IBS symptom management. Myriam Forehand, NP 06/05/2023, 3:22 PM

## 2023-06-05 NOTE — Progress Notes (Signed)
Pt is calm and cooperative, participates in unit activities, denies SI Hi Avh, mile anxiety, depression and hopelessness.  Goal is to work towards leaving.  Continued monitoring for safety.    06/05/23 1700  Psych Admission Type (Psych Patients Only)  Admission Status Involuntary  Psychosocial Assessment  Patient Complaints None  Eye Contact Brief  Facial Expression Flat  Affect Flat  Speech Logical/coherent  Interaction Avoidant  Motor Activity Slow  Appearance/Hygiene In scrubs  Behavior Characteristics Appropriate to situation  Mood Pleasant  Thought Process  Coherency WDL  Content WDL  Delusions None reported or observed  Perception WDL  Hallucination None reported or observed  Judgment WDL  Confusion WDL  Danger to Self  Current suicidal ideation? Denies  Agreement Not to Harm Self Yes  Danger to Others  Danger to Others None reported or observed

## 2023-06-05 NOTE — Group Note (Signed)
Date:  06/05/2023 Time:  11:18 AM  Group Topic/Focus:  Goals Group:   The focus of this group is to help patients establish daily goals to achieve during treatment and discuss how the patient can incorporate goal setting into their daily lives to aide in recovery.    Participation Level:  Active  Participation Quality:  Appropriate  Affect:  Appropriate  Cognitive:  Appropriate  Insight: Appropriate  Engagement in Group:  Engaged  Modes of Intervention:  Activity  Additional Comments:    Marshelle Bilger 06/05/2023, 11:18 AM

## 2023-06-05 NOTE — Group Note (Signed)
Date:  06/05/2023 Time:  4:20 AM  Group Topic/Focus:  Wrap-Up Group:   The focus of this group is to help patients review their daily goal of treatment and discuss progress on daily workbooks.    Participation Level:  Active  Participation Quality:  Appropriate and Attentive  Affect:  Appropriate  Cognitive:  Alert and Appropriate  Insight: Appropriate and Good  Engagement in Group:  Engaged and Improving  Modes of Intervention:  Discussion  Additional Comments:     [name],[name] 06/05/2023, 4:20 AM

## 2023-06-05 NOTE — Plan of Care (Signed)
  Problem: Education: Goal: Mental status will improve Outcome: Progressing   

## 2023-06-06 NOTE — Progress Notes (Signed)
D- Patient alert and oriented. Patient presented in a pleasant mood on assessment reporting that he slept good last night and had no complaints to voice to this Clinical research associate. Although patient endorsed hopelessness, depression, and anxiety on his self-inventory, he denied any signs/symptoms to this Clinical research associate. Patient also denied SI, HI, AVH, and pain at this time. Patient's goal for today is to "work on being present in the moment", in which he will "breathe and practice mindfulness", in order to achieve his goal.  A- Scheduled medications administered to patient, per MD orders. Support and encouragement provided. Routine safety checks conducted every 15 minutes. Patient informed to notify staff with problems or concerns.  R- No adverse drug reactions noted. Patient contracts for safety at this time. Patient compliant with medications and treatment plan. Patient receptive, calm, and cooperative. Patient interacts well with others on the unit. Patient remains safe at this time.   06/06/23 1030  Psych Admission Type (Psych Patients Only)  Admission Status Involuntary  Psychosocial Assessment  Patient Complaints None  Eye Contact Fair  Facial Expression Animated;Fixed smile  Affect Appropriate to circumstance  Speech Logical/coherent  Interaction Assertive  Motor Activity Slow  Appearance/Hygiene Unremarkable  Behavior Characteristics Cooperative;Appropriate to situation  Mood Pleasant  Aggressive Behavior  Effect No apparent injury  Thought Process  Coherency WDL  Content WDL  Delusions None reported or observed  Perception WDL  Hallucination None reported or observed  Judgment WDL  Confusion None  Danger to Self  Current suicidal ideation? Denies  Self-Injurious Behavior No self-injurious ideation or behavior indicators observed or expressed   Agreement Not to Harm Self Yes  Description of Agreement Verbal  Danger to Others  Danger to Others None reported or observed

## 2023-06-06 NOTE — BHH Counselor (Signed)
The patient asked to speak with a social worker to discuss resources for a psychiatrist. The patient also had questions about his discharge date. The patient stated that his bills were due yesterday and that he had to work tomorrow. The LCSWA informed the patient that she would asked about an anticipated discharge date and also look into the resources for the patient.    The LCSWA spoke with the NP about a possible discharge date. The NP stated that he may be discharged in the morning.    The LCSWA informed the patient that he may discharge tomorrow morning.    Patric Dykes, MSW, LCSWA  06/06/23 at 11:00 am

## 2023-06-06 NOTE — Group Note (Signed)
Date:  06/06/2023 Time:  10:27 AM  Group Topic/Focus:  Spirituality:   The focus of this group is to discuss how one's spirituality can aide in recovery.    Participation Level:  Active  Participation Quality:  Appropriate  Affect:  Appropriate  Cognitive:  Appropriate  Insight: Appropriate  Engagement in Group:  Engaged  Modes of Intervention:  Activity  Additional Comments:    Lawrence Robles [name] 06/06/2023, 10:27 AM

## 2023-06-06 NOTE — Plan of Care (Signed)

## 2023-06-06 NOTE — Group Note (Signed)
Date:  06/06/2023 Time:  9:50 PM  Group Topic/Focus:  Wrap-Up Group:   The focus of this group is to help patients review their daily goal of treatment and discuss progress on daily workbooks.    Participation Level:  Active  Participation Quality:  Appropriate and Attentive  Affect:  Appropriate  Cognitive:  Appropriate  Insight: Appropriate and Good  Engagement in Group:  Supportive  Modes of Intervention:  Support  Additional Comments:     Belva Crome 06/06/2023, 9:50 PM

## 2023-06-06 NOTE — Group Note (Signed)
Date:  06/06/2023 Time:  3:20 PM  Group Topic/Focus:  Activity Group:  The focus of the group is to promote activity for the patients and encourage them to come outside in the courtyard and get some fresh air and some exercise.    Participation Level:  Did Not Attend   Lawrence Robles 06/06/2023, 3:20 PM

## 2023-06-06 NOTE — Progress Notes (Signed)
[facility] MD Progress Note  06/06/2023 6:21 PM Lawrence Robles  MRN:  [id number] Subjective:  22 year old Hispanic male who inquires, "When can I go home?" He denies suicidal ideation (SI), homicidal ideation (HI), delusions, or hallucinations. The patient reports no acute concerns at this time. Principal Problem: MDD (major depressive disorder), recurrent episode, severe (HCC) Diagnosis: Principal Problem:   MDD (major depressive disorder), recurrent episode, severe (HCC) Active Problems:   Suicide attempt (HCC)  Total Time spent with patient: 45 minutes  Past Psychiatric History: The patient is stable, cooperative, and denies symptoms that would indicate acute distress or the need for immediate intervention.  Past Medical History: History reviewed. No pertinent past medical history.  Past Surgical History:  Procedure Laterality Date   APPENDECTOMY     Family History: History reviewed. No pertinent family history. Family Psychiatric  History: none reported Social History:  Social History   Substance and Sexual Activity  Alcohol Use Not Currently     Social History   Substance and Sexual Activity  Drug Use Yes   Types: Other-see comments   Comment: has a history of mental illness took a some of  prescriibed medication for his condition.    Social History   Socioeconomic History   Marital status: Single    Spouse name: Not on file   Number of children: Not on file   Years of education: Not on file   Highest education level: Not on file  Occupational History   Not on file  Tobacco Use   Smoking status: Never   Smokeless tobacco: Never  Vaping Use   Vaping status: Never Used  Substance and Sexual Activity   Alcohol use: Not Currently   Drug use: Yes    Types: Other-see comments    Comment: has a history of mental illness took a some of  prescriibed medication for his condition.   Sexual activity: Not on file  Other Topics Concern   Not on file  Social History Narrative    Not on file   Social Determinants of Health   Financial Resource Strain: Low Risk  (03/05/2021)   Received from [facility], [facility]   Overall Financial Resource Strain (CARDIA)    Difficulty of Paying Living Expenses: Not hard at all  Food Insecurity: No Food Insecurity (06/04/2023)   Hunger Vital Sign    Worried About Running Out of Food in the Last Year: Never true    Ran Out of Food in the Last Year: Never true  Transportation Needs: No Transportation Needs (06/04/2023)   PRAPARE - Administrator, Civil Service (Medical): No    Lack of Transportation (Non-Medical): No  Physical Activity: Inactive (03/05/2021)   Received from [facility], [facility]   Exercise Vital Sign    Days of Exercise per Week: 0 days    Minutes of Exercise per Session: 0 min  Stress: No Stress Concern Present (03/05/2021)   Received from [facility], [facility] of Occupational Health - Occupational Stress Questionnaire    Feeling of Stress : Only a little  Social Connections: Unknown (11/18/2021)   Received from [facility], [facility]   Social Network    Social Network: Not on file   Additional Social History:                         Sleep: Good  Appetite:  Good  Current Medications: Current Facility-Administered Medications  Medication  Dose Route Frequency Provider Last Rate Last Admin   acetaminophen (TYLENOL) tablet 650 mg  650 mg Oral Q6H PRN Oneta Rack, NP       alum & mag hydroxide-simeth (MAALOX/MYLANTA) 200-200-20 MG/5ML suspension 30 mL  30 mL Oral Q4H PRN Oneta Rack, NP       buPROPion [facility]) tablet 150 mg  150 mg Oral Daily Myriam Forehand, NP   150 mg at 06/06/23 0865   haloperidol (HALDOL) tablet 5 mg  5 mg Oral TID PRN Oneta Rack, NP       And   diphenhydrAMINE (BENADRYL) capsule 50 mg  50 mg Oral TID PRN Oneta Rack, NP       haloperidol lactate (HALDOL) injection 5 mg  5 mg Intramuscular TID  PRN Oneta Rack, NP       And   diphenhydrAMINE (BENADRYL) injection 50 mg  50 mg Intramuscular TID PRN Oneta Rack, NP       And   LORazepam (ATIVAN) injection 2 mg  2 mg Intramuscular TID PRN Oneta Rack, NP       haloperidol lactate (HALDOL) injection 10 mg  10 mg Intramuscular TID PRN Oneta Rack, NP       And   diphenhydrAMINE (BENADRYL) injection 50 mg  50 mg Intramuscular TID PRN Oneta Rack, NP       And   LORazepam (ATIVAN) injection 2 mg  2 mg Intramuscular TID PRN Oneta Rack, NP       feeding supplement (ENSURE ENLIVE / ENSURE PLUS) liquid 237 mL  1 Bottle Oral BID BM Myriam Forehand, NP   237 mL at 06/06/23 1030   hydrOXYzine (ATARAX) tablet 50 mg  50 mg Oral Q6H PRN Myriam Forehand, NP   50 mg at 06/05/23 1721   hyoscyamine (LEVBID) 0.375 MG 12 hr tablet 0.375 mg  0.375 mg Oral Q12H Myriam Forehand, NP   0.375 mg at 06/06/23 7846   magnesium hydroxide (MILK OF MAGNESIA) suspension 30 mL  30 mL Oral Daily PRN Oneta Rack, NP       traZODone (DESYREL) tablet 150 mg  150 mg Oral QHS Myriam Forehand, NP   150 mg at 06/05/23 2136    Lab Results: No results found for this or any previous visit (from the past 48 hour(s)).  Blood Alcohol level:  Lab Results  Component Value Date   ETH <10 06/02/2023    Metabolic Disorder Labs: No results found for: "HGBA1C", "MPG" No results found for: "PROLACTIN" No results found for: "CHOL", "TRIG", "HDL", "CHOLHDL", "VLDL", "LDLCALC"  Physical Findings: AIMS:  , ,  ,  ,    CIWA:    COWS:     Musculoskeletal: Strength & Muscle Tone: within normal limits Gait & Station: normal Patient leans: N/A  Psychiatric Specialty Exam:  Presentation  General Appearance:  Appropriate for Environment; Neat  Eye Contact: Good  Speech: Clear and Coherent; Normal Rate  Speech Volume: Normal  Handedness: Right   Mood and Affect  Mood: Anxious; Irritable  Affect: Appropriate; Congruent   Thought Process   Thought Processes: Coherent  Descriptions of Associations:Intact  Orientation:Full (Time, Place and Person)  Thought Content:WDL  History of Schizophrenia/Schizoaffective disorder:No  Duration of Psychotic Symptoms:none recorded Hallucinations:Hallucinations: None  Ideas of Reference:None  Suicidal Thoughts:Suicidal Thoughts: No  Homicidal Thoughts:Homicidal Thoughts: No   Sensorium  Memory: Immediate Good; Remote Good  Judgment: Fair  Insight: Fair  Executive Functions  Concentration: Fair  Attention Span: Fair  Recall: [employer] of Knowledge: Fair  Language: Fair   Psychomotor Activity  Psychomotor Activity: Psychomotor Activity: Normal   Assets  Assets: Manufacturing systems engineer; Financial Resources/Insurance; Housing; Social Support   Sleep  Sleep: Sleep: Good Number of Hours of Sleep: 8    Physical Exam: Physical Exam Vitals and nursing note reviewed.  Constitutional:      Appearance: Normal appearance.  HENT:     Head: Normocephalic and atraumatic.     Nose: Nose normal.  Pulmonary:     Effort: Pulmonary effort is normal.  Musculoskeletal:        General: Normal range of motion.     Cervical back: Normal range of motion.  Neurological:     General: No focal deficit present.     Mental Status: He is alert and oriented to person, place, and time. Mental status is at baseline.  Psychiatric:        Attention and Perception: Attention and perception normal.        Mood and Affect: Mood and affect normal.        Behavior: Behavior normal. Behavior is cooperative.        Thought Content: Thought content normal.        Cognition and Memory: Cognition and memory normal.        Judgment: Judgment normal.    ROS Blood pressure 117/72, pulse 82, temperature 97.9 F (36.6 C), resp. rate 17, height 5\' 8"  (1.727 m), weight 67.6 kg, SpO2 98%. Body mass index is 22.66 kg/m.   Treatment Plan Summary: Daily contact with patient to  assess and evaluate symptoms and progress in treatment and Medication management Wellbutrin (Bupropion XL) 150 mg Trazodone 100 mg at bedtime  Vistaril (Hydroxyzine) 50 mg as needed (PRN) for anxiety and agitation. Initiate Hyoscyamine 0.125 mg as needed for IBS symptoms. Educate the patient about Truvada and explore concerns regarding its use for prevention. Provide alternatives or explanations if medically necessary. Continue supportive counseling to address emotional distress and coping strategies. Provide psychoeducation about the importance of medication adherence and IBS symptom management. Myriam Forehand, NP 06/06/2023, 6:21 PM

## 2023-06-06 NOTE — Plan of Care (Signed)
  Problem: Education: Goal: Knowledge of [facility] Education information/materials will improve Outcome: Progressing Goal: Emotional status will improve Outcome: Progressing Goal: Mental status will improve Outcome: Progressing Goal: Verbalization of understanding the information provided will improve Outcome: Progressing   Problem: Coping: Goal: Ability to verbalize frustrations and anger appropriately will improve Outcome: Progressing

## 2023-06-06 NOTE — Group Note (Deleted)
Date:  06/06/2023 Time:  3:00 PM  Group Topic/Focus:  Healthy Communication:   The focus of this group is to discuss communication, barriers to communication, as well as healthy ways to communicate with others. Movies[employer]rapy encourages emotional release Even those who often have trouble expressing their emotions might find themselves laughing or crying during a film. This release of emotions can have a cathartic effect and also make it easier for a person to become more comfortable in expressing their emotions.     Participation Level:  {BHH PARTICIPATION ZHYQM:57846}  Participation Quality:  {BHH PARTICIPATION QUALITY:22265}  Affect:  {BHH AFFECT:22266}  Cognitive:  {BHH COGNITIVE:22267}  Insight: {BHH Insight2:20797}  Engagement in Group:  {BHH ENGAGEMENT IN GROUP:22268}  Modes of Intervention:  {BHH MODES OF INTERVENTION:22269}  Additional Comments:  ***  Lawrence Robles 06/06/2023, 3:00 PM

## 2023-06-06 NOTE — Progress Notes (Signed)
Patient pleasant and cooperative with care. Denies SI, HI, AVh. Medication compliant, appropriate with staff and peers. Encouragement and support provided. Safety checks maintained. Medications given as prescribed. Pt receptive and remains safe on unit with q 15 min checks.

## 2023-06-07 MED ORDER — HYDROXYZINE HCL 50 MG PO TABS: 50.0000 mg | ORAL_TABLET | Freq: Four times a day (QID) | ORAL | 0 refills | Status: AC | PRN

## 2023-06-07 MED ORDER — BUPROPION HCL 75 MG PO TABS: 150.0000 mg | ORAL_TABLET | Freq: Every day | ORAL | 0 refills | Status: AC

## 2023-06-07 MED ORDER — HYOSCYAMINE SULFATE ER 0.375 MG PO TB12: 0.3750 mg | ORAL_TABLET | Freq: Two times a day (BID) | ORAL | 0 refills | Status: AC

## 2023-06-07 MED ORDER — TRAZODONE HCL 150 MG PO TABS: 150.0000 mg | ORAL_TABLET | Freq: Every day | ORAL | 0 refills | Status: AC

## 2023-06-07 NOTE — BHH Suicide Risk Assessment (Signed)
[facility] Discharge Suicide Risk Assessment   Principal Problem: MDD (major depressive disorder), recurrent episode, severe (HCC) Discharge Diagnoses: Principal Problem:   MDD (major depressive disorder), recurrent episode, severe (HCC) Active Problems:   Suicide attempt (HCC)   Total Time spent with patient: 2 hours  Musculoskeletal: Strength & Muscle Tone: within normal limits Gait & Station: normal Patient leans: N/A  Psychiatric Specialty Exam  Presentation  General Appearance:  Appropriate for Environment; Well Groomed; Neat  Eye Contact: Good  Speech: Clear and Coherent; Normal Rate  Speech Volume: Normal  Handedness: Right   Mood and Affect  Mood: Euthymic  Duration of Depression Symptoms: Greater than two weeks  Affect: Appropriate   Thought Process  Thought Processes: Coherent  Descriptions of Associations:Tangential  Orientation:Full (Time, Place and Person) (and situation)  Thought Content:WDL  History of Schizophrenia/Schizoaffective disorder:No  Duration of Psychotic Symptoms:No data recorded Hallucinations:Hallucinations: None  Ideas of Reference:None  Suicidal Thoughts:Suicidal Thoughts: No  Homicidal Thoughts:Homicidal Thoughts: No   Sensorium  Memory: Immediate Good; Remote Good  Judgment: Fair  Insight: Fair   Art therapist  Concentration: Good  Attention Span: Fair  Recall: Fair  Fund of Knowledge: Good  Language: Good   Psychomotor Activity  Psychomotor Activity: Psychomotor Activity: Normal   Assets  Assets: Communication Skills; Physical Health; Financial Resources/Insurance; Housing   Sleep  Sleep: Sleep: Good Number of Hours of Sleep: 8   Physical Exam: Physical Exam Vitals and nursing note reviewed.  Constitutional:      Appearance: Normal appearance.  HENT:     Head: Normocephalic and atraumatic.     Nose: Nose normal.  Pulmonary:     Effort: Pulmonary effort is normal.   Musculoskeletal:        General: Normal range of motion.     Cervical back: Normal range of motion.  Neurological:     Mental Status: He is alert.  Psychiatric:        Attention and Perception: Attention and perception normal.        Mood and Affect: Mood and affect normal.        Speech: Speech normal.        Behavior: Behavior normal. Behavior is cooperative.        Thought Content: Thought content normal.        Cognition and Memory: Cognition and memory normal.        Judgment: Judgment normal.    Review of Systems  All other systems reviewed and are negative.  Blood pressure 94/63, pulse 88, temperature 97.7 F (36.5 C), resp. rate 13, height 5\' 8"  (1.727 m), weight 67.6 kg, SpO2 98%. Body mass index is 22.66 kg/m.  Mental Status Per Nursing Assessment::   On Admission:  Suicidal ideation indicated by patient  Demographic Factors:  Male, Adolescent or young adult, and Gay, lesbian, or bisexual orientation  Loss Factors: Loss of significant relationship  Historical Factors: Prior suicide attempts, Family history of mental illness or substance abuse, and Impulsivity  Risk Reduction Factors:   Sense of responsibility to family, Employed, Living with another person, especially a relative, Positive social support, Positive therapeutic relationship, and Positive coping skills or problem solving skills  Continued Clinical Symptoms:  Anorexia Nervosa Depression:   Impulsivity Insomnia  Cognitive Features That Contribute To Risk:  None    Suicide Risk:  Minimal: No identifiable suicidal ideation.  Patients presenting with no risk factors but with morbid ruminations; may be classified as minimal risk based on the severity of the  depressive symptoms   Follow-up Information     [facility] Follow up.   Why: Appointment schedualed for 06/15/23 at 8:30 via the phone. You will receive a call at [phone] Contact information: [address]  [address] [place]  [address] [phone]         [facility] Go to.   Why: [facility] to reach out via email/phone to scheduale appointment. Referral sent on patient's behalf. Could take up to 23-48 hours. Contact information: [address] [place] [address] [phone]                 Plan Of Care/Follow-up recommendations:  Activity:  as tolerated Diet:  heart healthy Wellbutrin (Bupropion XL): 150 mg daily for depression. Trazodone: 100 mg at bedtime for sleep disturbances. Vistaril (Hydroxyzine): 50 mg PRN for anxiety and agitation. [facility]: [address], [address], [place], [place] [address]. Phone: [phone]. Follow-Up: Scheduled phone appointment on 06/15/23 at 8:30 AM. [facility]: Address: [address], [place], [place] [address]. Phone: [phone]. Note: Referral sent; patient to follow up for appointment scheduling within 24-48 hours 988 Suicide & Crisis Lifeline available 24/7. 58 Suicide & Crisis Lifeline: Call or text 988 for free, confidential support from trained counselors. Services are available in [place] and [place].  [facility] Peer Warmline: Call [phone] to speak with a peer support specialist who has lived experience with mental health challenges.  [facility] Mobile Crisis: Available 24/7, this service dispatches crisis counselors to your location. Call [phone] Myriam Forehand, NP 06/07/2023, 2:10 PM

## 2023-06-07 NOTE — Discharge Summary (Signed)
Physician Discharge Summary Note  Patient:  Lawrence Robles is an 22 y.o., male MRN:  [id number] DOB:  2000/10/13 Patient phone:  [phone] (home)  Patient address:   [address] [place] [address],  Total Time spent with patient: 1 hour  Date of Admission:  06/04/2023 Date of Discharge: 06/07/2023  Reason for Admission:  22 year old Hispanic male, was admitted under involuntary commitment (IVC) after expressing suicidal ideation with a plan to overdose on pills. He texted a friend stating he was "tired of it all" and made an attempt by taking some pain medication, though not a lethal amount. This was his first suicide attempt, but he also disclosed a history of self-harm through cutting, with the most recent episode occurring one week prior.The patient reported feeling overwhelmed and emotionally trapped due to personal and family stressors, including his mother reentering his life after being released from jail, which has disrupted his sense of safety in the home he shares with his grandmother. He also described longstanding depression since 2020, which had been well-managed until this year, and attributed recent struggles to the loss of his psychiatric provider and the discontinuation of his medications, including ADHD and sleep medications.The patient presented with symptoms of depression, anxiety, and hopelessness, necessitating inpatient admission for mood stabilization, safety, and to establish a follow-up plan for psychiatric and therapeutic care.  Principal Problem: MDD (major depressive disorder), recurrent episode, severe (HCC) Discharge Diagnoses: Principal Problem:   MDD (major depressive disorder), recurrent episode, severe (HCC) Active Problems:   Suicide attempt [facility])   Past Psychiatric History: ADHD Anxiety Depression  Past Medical History: History reviewed. No pertinent past medical history.  Past Surgical History:  Procedure Laterality Date   APPENDECTOMY      Family History: History reviewed. No pertinent family history. Family Psychiatric  History: none reported Social History:  Social History   Substance and Sexual Activity  Alcohol Use Not Currently     Social History   Substance and Sexual Activity  Drug Use Yes   Types: Other-see comments   Comment: has a history of mental illness took a some of  prescriibed medication for his condition.    Social History   Socioeconomic History   Marital status: Single    Spouse name: Not on file   Number of children: Not on file   Years of education: Not on file   Highest education level: Not on file  Occupational History   Not on file  Tobacco Use   Smoking status: Never   Smokeless tobacco: Never  Vaping Use   Vaping status: Never Used  Substance and Sexual Activity   Alcohol use: Not Currently   Drug use: Yes    Types: Other-see comments    Comment: has a history of mental illness took a some of  prescriibed medication for his condition.   Sexual activity: Not on file  Other Topics Concern   Not on file  Social History Narrative   Not on file   Social Determinants of Health   Financial Resource Strain: Low Risk  (03/05/2021)   Received [facility]   Overall Financial Resource Strain (CARDIA)    Difficulty of Paying Living Expenses: Not hard at all  Food Insecurity: No Food Insecurity (06/04/2023)   Hunger Vital Sign    Worried About Running Out of Food in the Last Year: Never true    Ran Out of Food in the Last Year: Never true  Transportation Needs: No Transportation  Needs (06/04/2023)   PRAPARE - Administrator, Civil Service (Medical): No    Lack of Transportation (Non-Medical): No  Physical Activity: Inactive (03/05/2021)   [facility]   Exercise Vital Sign    Days of Exercise per Week: 0 days    Minutes of Exercise per Session: 0 min  Stress: No Stress Concern Present (03/05/2021)   Received from  [facility], [facility] - Occupational Stress Questionnaire    Feeling of Stress : Only a little  Social Connections: Unknown (11/18/2021)   Received from [facility], [facility]   Social Network    Social Network: Not on file    Hospital Course:  22 year old Hispanic male, was admitted under IVC for stabilization following suicidal ideation with a plan to overdose on pills and a history of recent self-harm. During his stay, the patient was closely monitored and treated for mood stabilization, emotional distress, and anxiety.Upon admission, the patient expressed feelings of emotional overwhelm and hopelessness but denied current suicidal ideation or plans.He was noted to have a flat and guarded affect initially but engaged well during evaluation and was cooperative throughout his stay.Restarted on medications to address depressive and anxiety symptoms: Wellbutrin (Bupropion XL): 150 mg daily for depression and mood regulation.Trazodone: 100 mg nightly for insomnia.Vistaril (Hydroxyzine): 50 mg PRN for anxiety and agitation.He tolerated medications well, with no reported side effects.The patient participated in individual therapy sessions, focusing on identifying triggers, coping mechanisms, and stress management strategies.Psychoeducation was provided on managing depression, reducing self-harm behaviors, and recognizing early signs of emotional distress.The patient shared frustrations about family dynamics, particularly regarding his grandmother's decisions and his mother's reentry into his life. Providers encouraged setting boundaries and discussed the importance of maintaining a safe and supportive living environment.The patient was observed to be calm and cooperative throughout his stay. Denied any recurrence of suicidal or homicidal ideation, hallucinations, or delusions. Sleep and appetite remained poor but improved slightly during  hospitalization. The patient demonstrated improved mood and expressed readiness to transition to outpatient care. Crisis resources and a safety plan were provided. Follow-up appointments were scheduled for ongoing therapy and psychiatric management.   Musculoskeletal: Strength & Muscle Tone: within normal limits Gait & Station: normal Patient leans: N/A   Psychiatric Specialty Exam:  Presentation  General Appearance:  Appropriate for Environment; Well Groomed; Neat  Eye Contact: Good  Speech: Clear and Coherent; Normal Rate  Speech Volume: Normal  Handedness: Right   Mood and Affect  Mood: Euthymic  Affect: Appropriate   Thought Process  Thought Processes: Coherent  Descriptions of Associations:Tangential  Orientation:Full (Time, Place and Person) (and situation)  Thought Content:WDL  History of Schizophrenia/Schizoaffective disorder:No  Duration of Psychotic Symptoms:No data recorded Hallucinations:Hallucinations: None  Ideas of Reference:None  Suicidal Thoughts:Suicidal Thoughts: No  Homicidal Thoughts:Homicidal Thoughts: No   Sensorium  Memory: Immediate Good; Remote Good  Judgment: Fair  Insight: Fair   Art therapist  Concentration: Good  Attention Span: Fair  Recall: Fair  Fund of Knowledge: Good  Language: Good   Psychomotor Activity  Psychomotor Activity: Psychomotor Activity: Normal   Assets  Assets: Communication Skills; Physical Health; Financial Resources/Insurance; Housing   Sleep  Sleep: Sleep: Good Number of Hours of Sleep: 8    Physical Exam: Physical Exam Vitals and nursing note reviewed.  Constitutional:      Appearance: Normal appearance.  HENT:     Head: Normocephalic and atraumatic.  Nose: Nose normal.  Pulmonary:     Effort: Pulmonary effort is normal.  Musculoskeletal:        General: Normal range of motion.     Cervical back: Normal range of motion.  Neurological:      General: No focal deficit present.     Mental Status: He is alert and oriented to person, place, and time. Mental status is at baseline.  Psychiatric:        Attention and Perception: Attention and perception normal.        Mood and Affect: Mood and affect normal.        Speech: Speech normal.        Behavior: Behavior normal. Behavior is cooperative.        Thought Content: Thought content normal.        Cognition and Memory: Cognition and memory normal.        Judgment: Judgment normal.    Review of Systems  All other systems reviewed and are negative.  Blood pressure 94/63, pulse 88, temperature 97.7 F (36.5 C), resp. rate 13, height 5\' 8"  (1.727 m), weight 67.6 kg, SpO2 98%. Body mass index is 22.66 kg/m.   Social History   Tobacco Use  Smoking Status Never  Smokeless Tobacco Never   Tobacco Cessation:  N/A, patient does not currently use tobacco products   Blood Alcohol level:  Lab Results  Component Value Date   ETH <10 06/02/2023    Metabolic Disorder Labs:  No results found for: "HGBA1C", "MPG" No results found for: "PROLACTIN" No results found for: "CHOL", "TRIG", "HDL", "CHOLHDL", "VLDL", "LDLCALC"  See Psychiatric Specialty Exam and Suicide Risk Assessment completed by Attending Physician prior to discharge.  Discharge destination:  Home  Is patient on multiple antipsychotic therapies at discharge:  No   Has Patient had three or more failed trials of antipsychotic monotherapy by history:  No  Recommended Plan for Multiple Antipsychotic Therapies: NA   Allergies as of 06/07/2023       Reactions   Neosporin [bacitracin-polymyxin B] Itching        Medication List     STOP taking these medications    Azelaic Acid 15 % gel   emtricitabine-tenofovir 200-300 MG tablet Commonly known as: TRUVADA   finasteride 1 MG tablet Commonly known as: PROPECIA   minoxidil 2.5 MG tablet Commonly known as: LONITEN   Oracea 40 MG Cpdr Generic drug:  Doxycycline (Rosacea)   Sulfacetamide Sodium-Sulfur 10-5 % Crea Commonly known as: Avar-e Emollient   tacrolimus 0.1 % ointment Commonly known as: PROTOPIC   triamcinolone ointment 0.1 % Commonly known as: KENALOG       TAKE these medications      Indication  Adapalene-Benzoyl Peroxide 0.3-2.5 % Gel apply a small amount to affected area once a day for acne  Indication: Common Acne   buPROPion 75 MG tablet Commonly known as: WELLBUTRIN Take 2 tablets (150 mg total) by mouth daily. Start taking on: June 08, 2023  Indication: Depression   hydrOXYzine 50 MG tablet Commonly known as: ATARAX Take 1 tablet (50 mg total) by mouth every 6 (six) hours as needed for anxiety.  Indication: Feeling Anxious, Feeling Tense   hyoscyamine 0.375 MG 12 hr tablet Commonly known as: LEVBID Take 1 tablet (0.375 mg total) by mouth every 12 (twelve) hours. What changed: when to take this  Indication: Irritable Bowel Syndrome   Mounjaro 15 MG/0.5ML Pen Generic drug: tirzepatide Inject 15 mg under the skin every  seven (7) days for diabetes.  Indication: Type 2 Diabetes   Quviviq 50 MG Tabs Generic drug: Daridorexant HCl Take 50 mg by mouth daily.  Indication: Trouble Sleeping   traZODone 150 MG tablet Commonly known as: DESYREL Take 1 tablet (150 mg total) by mouth at bedtime.  Indication: Trouble Sleeping        Follow-up Information     [facility] Follow up.   Why: Appointment schedualed for 06/15/23 at 8:30 via the phone. You will receive a call at [phone] Contact information: [address]  [address] [place] [address] [phone]         [facility] Go to.   Why: [facility] to reach out via email/phone to scheduale appointment. Referral sent on patient's behalf. Could take up to 23-48 hours. Contact information: [address] [place] [address] [phone]                  Follow-up recommendations:  Activity:  as tolerated Diet:  heart healthy  Comments:   Wellbutrin (Bupropion XL): 150 mg daily for depression. Trazodone: 100 mg at bedtime for sleep disturbances. Vistaril (Hydroxyzine): 50 mg PRN for anxiety and agitation. [facility]: [address], [place], [place] [address]. Phone: [phone]. Follow-Up: Scheduled phone appointment on 06/15/23 at 8:30 AM. [facility]: Address: [address], [address], [place], [place] [address]. Phone: [phone]. Note: Referral sent; patient to follow up for appointment scheduling within 24-48 hours 988 Suicide & Crisis Lifeline available 24/7. 16 Suicide & Crisis Lifeline: Call or text 988 for free, confidential support from trained counselors. Services are available in [place] and [place].  [facility] Peer Warmline: Call [phone] to speak with a peer support specialist who has lived experience with mental health challenges.  [facility] Mobile Crisis: Available 24/7, this service dispatches crisis counselors to your location. Call [phone]  Signed: Myriam Forehand, NP 06/07/2023, 2:36 PM

## 2023-06-07 NOTE — Group Note (Signed)
Recreation Therapy Group Note   Group Topic:Coping Skills  Group Date: 06/07/2023 Start Time: 1010 End Time: 1105 Facilitators: Rosina Lowenstein, LRT, CTRS Location:  Craft Room  Group Description: Mind Map.  Patient was provided a blank template of a diagram with 32 blank boxes in a tiered system, branching from the center (similar to a bubble chart). LRT directed patients to label the middle of the diagram "Coping Skills". LRT and patients then came up with 8 different coping skills as examples. Pt were directed to record their coping skills in the 2nd tier boxes closest to the center.  Patients would then share their coping skills with the group as LRT wrote them out. LRT gave a handout of 99 different coping skills at the end of group.   Goal Area(s) Addressed: Patients will be able to define "coping skills". Patient will identify new coping skills.  Patient will increase communication.   Affect/Mood: Appropriate   Participation Level: Active and Engaged   Participation Quality: Independent   Behavior: Calm and Cooperative   Speech/Thought Process: Coherent   Insight: Good   Judgement: Good   Modes of Intervention: Education, Group work, Guided Discussion, and Worksheet   Patient Response to Interventions:  Attentive, Engaged, Interested , and Receptive   Education Outcome:  Acknowledges education   Clinical Observations/Individualized Feedback: Lawrence Robles was active in their participation of session activities and group discussion. Pt identified "self love, journal, being in nature, therapy" as coping skills. Pt spontaneously contributed to group discussion while interacting well with LRT and peers duration of session.    Plan: Continue to engage patient in RT group sessions 2-3x/week.   Rosina Lowenstein, LRT, CTRS 06/07/2023 12:37 PM

## 2023-06-07 NOTE — Progress Notes (Signed)
Patient pleasant and cooperative on approach. Denies SI,HI and AVH. Verbalized understanding discharge instructions,prescriptions and follow up care.  All belongings returned from BMU locker. Suicide safety plan filled by patient and placed in chart. Copy given to patient.Patient escorted out by staff and transported by family. 

## 2023-06-07 NOTE — Progress Notes (Signed)
  [facility] Adult Case Management Discharge Plan :  Will you be returning to the same living situation after discharge:  No.Patient to live with a friend temporarily.  At discharge, do you have transportation home?: Yes,  Patient's friend to provide transportation.  Do you have the ability to pay for your medications: BLUE CROSS BLUE SHIELD / BCBS COMM PPO   Release of information consent forms completed and in the chart;  Patient's signature needed at discharge.  Patient to Follow up at:  Follow-up Information     [facility] Follow up.   Why: Appointment schedualed for 06/15/23 at 8:30 via the phone. You will receive a call at [phone] Contact information: [address]  [address] [place] [address] [phone]         [facility] Go to.   Why: [facility] to reach out via email/phone to scheduale appointment. Referral sent on patient's behalf. Could take up to 23-48 hours. Contact information: [address] [place] [address] [phone]                 Next level of care provider has access to [facility] Link:no  Safety Planning and Suicide Prevention discussed: No. CSW unable to make contact after 2 unsuccessful attempts. SPE materials provided at discharge.      Has patient been referred to the Quitline?: Patient does not use tobacco/nicotine products  Patient has been referred for addiction treatment: No known substance use disorder.  Lowry Ram, LCSW 06/07/2023, 11:39 AM

## 2023-06-07 NOTE — Group Note (Signed)
Recreation Therapy Group Note   Group Topic:Health and Wellness  Group Date: 06/07/2023 Start Time: 1530 End Time: 1615 Facilitators: Rosina Lowenstein, LRT, CTRS Location:  Dayroom  Group Description: Seated Exercise. LRT discussed the mental and physical benefits of exercise. LRT and group discussed how physical activity can be used as a coping skill. Pt's and LRT followed along to an exercise video on the TV screen that provided a visual representation and audio description of every exercise performed. Pt's encouraged to listen to their bodies and stop at any time if they experience feelings of discomfort or pain. Pts were encouraged to drink water and stay hydrated.   Goal Area(s) Addressed: Patient will learn benefits of physical activity. Patient will identify exercise as a coping skill.  Patient will follow multistep directions. Patient will try a new leisure interest.    Affect/Mood: Appropriate   Participation Level: Minimal    Clinical Observations/Individualized Feedback: Patient was in and out of group many times preparing for discharge.   Plan: Continue to engage patient in RT group sessions 2-3x/week.   Rosina Lowenstein, LRT, CTRS 06/07/2023 5:01 PM

## 2023-06-07 NOTE — Group Note (Signed)
Date:  06/07/2023 Time:  10:32 AM  Group Topic/Focus:  Dimensions of Wellness:   The focus of this group is to introduce the topic of wellness and discuss the role each dimension of wellness plays in total health. Goals Group:   The focus of this group is to help patients establish daily goals to achieve during treatment and discuss how the patient can incorporate goal setting into their daily lives to aide in recovery. Self Care:   The focus of this group is to help patients understand the importance of self-care in order to improve or restore emotional, physical, spiritual, interpersonal, and financial health.    Participation Level:  Active  Participation Quality:  Appropriate and Attentive  Affect:  Appropriate  Cognitive:  Alert, Appropriate, and Oriented  Insight: Appropriate  Engagement in Group:  Developing/Improving and Engaged  Modes of Intervention:  Activity, Discussion, Education, and Support  Additional Comments:    Lawrence Robles 06/07/2023, 10:32 AM

## 2023-06-07 NOTE — Group Note (Signed)
Date:  06/07/2023 Time:  3:53 PM  Group Topic/Focus:  Healthy Communication:   The focus of this group is to discuss communication, barriers to communication, as well as healthy ways to communicate with others. Music Therapy Art therapy    Participation Level:  Active  Participation Quality:  Appropriate  Affect:  Appropriate  Cognitive:  Appropriate  Insight: Appropriate  Engagement in Group:  Developing/Improving and Engaged  Modes of Intervention:  Activity  Additional Comments:    Lawrence Robles 06/07/2023, 3:53 PM

## 2023-06-07 NOTE — Group Note (Signed)
LCSW Group Therapy Note   Group Date: 06/07/2023 Start Time: 1300 End Time: 1400   Type of Therapy and Topic:  Group Therapy: Challenging Core Beliefs  Participation Level:  Active  Description of Group:  Patients were educated about core beliefs and asked to identify one harmful core belief that they have. Patients were asked to explore from where those beliefs originate. Patients were asked to discuss how those beliefs make them feel and the resulting behaviors of those beliefs. They were then be asked if those beliefs are true and, if so, what evidence they have to support them. Lastly, group members were challenged to replace those negative core beliefs with helpful beliefs.   Therapeutic Goals:   1. Patient will identify harmful core beliefs and explore the origins of such beliefs. 2. Patient will identify feelings and behaviors that result from those core beliefs. 3. Patient will discuss whether such beliefs are true. 4.  Patient will replace harmful core beliefs with helpful ones.  Summary of Patient Progress:  Patient actively engaged in processing and exploring how core beliefs are formed and how they impact thoughts, feelings, and behaviors. Patient proved open to input from peers and feedback from CSW. Patient demonstrated proficient insight into the subject matter, was respectful and supportive of peers, and participated throughout the entire session.  Therapeutic Modalities: Cognitive Behavioral Therapy; Solution-Focused Therapy   Lowry Ram, LCSWA 06/07/2023  2:06 PM

## 2023-06-16 ENCOUNTER — Encounter: Payer: Self-pay | Admitting: Dermatology

## 2023-06-16 DIAGNOSIS — L719 Rosacea, unspecified: Secondary | ICD-10-CM

## 2023-06-16 MED ORDER — ORACEA 40 MG PO CPDR: 40.0000 mg | DELAYED_RELEASE_CAPSULE | Freq: Every day | ORAL | 1 refills | Status: AC

## 2023-12-30 ENCOUNTER — Ambulatory Visit: Payer: Medicaid Other | Admitting: Dermatology

## 2024-01-27 ENCOUNTER — Ambulatory Visit: Admitting: Dermatology

## 2024-02-12 ENCOUNTER — Other Ambulatory Visit: Payer: Self-pay | Admitting: Dermatology

## 2024-02-12 DIAGNOSIS — L659 Nonscarring hair loss, unspecified: Secondary | ICD-10-CM
